# Patient Record
Sex: Female | Born: 1955 | Race: White | Hispanic: No | Marital: Married | Smoking: Former smoker
Health system: Southern US, Community
[De-identification: ages and names within clinical notes are randomized; demographics above are authoritative.]

## PROBLEM LIST (undated history)

## (undated) DIAGNOSIS — E785 Hyperlipidemia, unspecified: Secondary | ICD-10-CM

## (undated) DIAGNOSIS — E669 Obesity, unspecified: Secondary | ICD-10-CM

## (undated) DIAGNOSIS — I10 Essential (primary) hypertension: Secondary | ICD-10-CM

## (undated) DIAGNOSIS — T7840XA Allergy, unspecified, initial encounter: Secondary | ICD-10-CM

## (undated) DIAGNOSIS — J449 Chronic obstructive pulmonary disease, unspecified: Secondary | ICD-10-CM

## (undated) DIAGNOSIS — F32A Depression, unspecified: Secondary | ICD-10-CM

## (undated) DIAGNOSIS — D329 Benign neoplasm of meninges, unspecified: Secondary | ICD-10-CM

## (undated) DIAGNOSIS — M1990 Unspecified osteoarthritis, unspecified site: Secondary | ICD-10-CM

## (undated) DIAGNOSIS — M810 Age-related osteoporosis without current pathological fracture: Secondary | ICD-10-CM

## (undated) DIAGNOSIS — F419 Anxiety disorder, unspecified: Secondary | ICD-10-CM

## (undated) DIAGNOSIS — E8881 Metabolic syndrome: Secondary | ICD-10-CM

## (undated) DIAGNOSIS — Z5189 Encounter for other specified aftercare: Secondary | ICD-10-CM

## (undated) HISTORY — DX: Allergy, unspecified, initial encounter: T78.40XA

## (undated) HISTORY — DX: Chronic obstructive pulmonary disease, unspecified: J44.9

## (undated) HISTORY — PX: CHOLECYSTECTOMY: SHX55

## (undated) HISTORY — DX: Metabolic syndrome: E88.81

## (undated) HISTORY — DX: Depression, unspecified: F32.A

## (undated) HISTORY — DX: Encounter for other specified aftercare: Z51.89

## (undated) HISTORY — DX: Metabolic syndrome: E88.810

## (undated) HISTORY — DX: Hyperlipidemia, unspecified: E78.5

## (undated) HISTORY — PX: DERMOID CYST  EXCISION: SHX1452

## (undated) HISTORY — DX: Benign neoplasm of meninges, unspecified: D32.9

## (undated) HISTORY — DX: Anxiety disorder, unspecified: F41.9

## (undated) HISTORY — PX: APPENDECTOMY: SHX54

## (undated) HISTORY — PX: ECTOPIC PREGNANCY SURGERY: SHX613

## (undated) HISTORY — DX: Essential (primary) hypertension: I10

## (undated) HISTORY — DX: Obesity, unspecified: E66.9

## (undated) HISTORY — DX: Age-related osteoporosis without current pathological fracture: M81.0

## (undated) HISTORY — PX: SPLENECTOMY, TOTAL: SHX788

## (undated) HISTORY — DX: Unspecified osteoarthritis, unspecified site: M19.90

---

## 2006-06-06 ENCOUNTER — Ambulatory Visit: Payer: Self-pay | Admitting: Oncology

## 2011-08-01 ENCOUNTER — Ambulatory Visit (INDEPENDENT_AMBULATORY_CARE_PROVIDER_SITE_OTHER): Payer: Self-pay | Admitting: Surgery

## 2011-08-02 ENCOUNTER — Ambulatory Visit (INDEPENDENT_AMBULATORY_CARE_PROVIDER_SITE_OTHER): Payer: Medicare Other | Admitting: Surgery

## 2011-08-09 ENCOUNTER — Ambulatory Visit (INDEPENDENT_AMBULATORY_CARE_PROVIDER_SITE_OTHER): Payer: Medicare Other | Admitting: Surgery

## 2011-08-09 ENCOUNTER — Encounter (INDEPENDENT_AMBULATORY_CARE_PROVIDER_SITE_OTHER): Payer: Self-pay | Admitting: Surgery

## 2011-08-09 ENCOUNTER — Other Ambulatory Visit (INDEPENDENT_AMBULATORY_CARE_PROVIDER_SITE_OTHER): Payer: Self-pay

## 2011-08-09 DIAGNOSIS — E669 Obesity, unspecified: Secondary | ICD-10-CM

## 2011-08-09 NOTE — Progress Notes (Signed)
Re:   Shannon Hobbs DOB:   12-08-1955 MRN:   [id number]  ASSESSMENT AND PLAN: 1.  Morbid obesity.  Weight 314, BMI - 53.6.  Per the 1991 NIH Consensus Statement, the patient is a candidate for bariatric surgery.  The patient attended our information session and reviewed the different types of bariatric surgery.    The patient is interested in the laparoscopic adjustable gastric band.  I discussed with the patient the indications and risks of lap band surgery.  The potential risks of surgery include, but are not limited to, bleeding, infection, DVT and PE, slippage and erosion of the band, open surgery, and death.  The patient understands the importance of compliance and long term follow-up with our group after surgery.  She was given literature regarding lap band surgery and encouraged to visit their web site, [email or web address], and register.  From here we'll obtain labs, x-rays, nutrition consult, and psych consult.   2.  Hypertension. 3.  Rheumatoid arthritis -  At least 20 years.  Was followed by Dr. Phylliss Bob, now followed by Dr. Dareen Piano. 4.  Chronic steroids - prednisone x 2 years.  I discussed some of the problems with steroids in weight loss surgery - less weight loss, increased risks of erosion, and increased risks of infection. 5.  History of splenectomy/ectopic pregnancy.  No chief complaint on file.  REFERRING PHYSICIAN: Galvin Proffer, MD  HISTORY OF PRESENT ILLNESS: Shannon Hobbs is a 56 y.o. (DOB: 05-Jun-1956)  white female whose primary care physician is HAGUE, Myrene Galas, MD and comes to me today for bariatric surgery.  Ms. Remmers heard Dr. Biagio Quint at the information session.  She has  tried multiple diets including Weight Watchers, TOPS, overeaters anonymous, and low-calorie diets. She has tried prescriptions such as Adipex and a medication that starts with a letter M.  She does not know anyone who has had a lap band.  I encouraged her to go to the support group to meet someone who  has had the procedure.  She had an ectopic pregnancy.  She ended up getting a splenectomy, appendectomy, in addition to managing the ectopic. No history of stomach disease.  No history of liver disease.  Lap chole - 2008 in [place].  No history of pancreas disease.  No history of colon disease.    Past Medical History  Diagnosis Date  . Arthritis   . Hypertension   . Obesity   . Dysmetabolic syndrome       Past Surgical History  Procedure Date  . Dermoid cyst  excision   . Ectopic pregnancy surgery   . Appendectomy   . Cholecystectomy   . Splenectomy, total       Current Outpatient Prescriptions  Medication Sig Dispense Refill  . hydrochlorothiazide (HYDRODIURIL) 25 MG tablet Take 25 mg by mouth daily.      Marland Kitchen ibuprofen (ADVIL,MOTRIN) 200 MG tablet Take 200 mg by mouth every 6 (six) hours as needed.      . Methotrexate Sodium (METHOTREXATE PO) Take 6 mLs by mouth once a week.      . predniSONE (DELTASONE) 10 MG tablet Take 10 mg by mouth daily.          Allergies  Allergen Reactions  . Dilaudid (Hydromorphone Hcl)     REVIEW OF SYSTEMS: Skin:  No history of rash.  No history of abnormal moles. Infection:  No history of hepatitis or HIV.  No history of MRSA. Neurologic:  No history of stroke.  No history of seizure.  No history of headaches. Cardiac:  Hypertension x 2 years.  But she admits that she is not good about taking her meds. Pulmonary:  Does not smoke cigarettes.  No asthma or bronchitis.  No OSA/CPAP.  Endocrine:  No diabetes. No thyroid disease. Gastrointestinal: See HPI. Urologic:  No history of kidney stones.  No history of bladder infections. Musculoskeletal:  History of rheumatoid arthritis.  On methotrexate/prednisone.  Followed by Dr. Azzie Roup.  Hematologic:  No bleeding disorder.  No history of anemia.  Not anticoagulated. Psycho-social:  The patient is oriented.   The patient has no obvious psychologic or social impairment to understanding our  conversation and plan.  SOCIAL and FAMILY HISTORY: Recently married. On disability since 2007 secondary to rheumatoid arthritis.  PHYSICAL EXAM: There were no vitals taken for this visit.  General: WN morbidly obese WF who is alert and generally healthy appearing.  HEENT: Normal. Pupils equal. Good dentition. Neck: Supple. No mass.  No thyroid mass.  Carotid pulse okay with no bruit. Lymph Nodes:  No supraclavicular or cervical nodes. Lungs: Clear to auscultation and symmetric breath sounds. Heart:  RRR. No murmur or rub.  Abdomen: Soft. No mass. No tenderness. No hernia. Normal bowel sounds.  Long midline (or to left of midline) incision.  She said they had no trouble with her lap chole. Rectal: No mass.  Guaiac neg. Extremities:  Good strength and ROM  in upper and lower extremities. Neurologic:  Grossly intact to motor and sensory function. Psychiatric: Has normal mood and affect. Behavior is normal.   DATA REVIEWED: Didn't have much  Ovidio Kin, MD,  [facility], [place] [address] [place]  [address]   [place], [place]    [address] Phone:  [phone] FAX:  [phone]

## 2011-08-26 ENCOUNTER — Encounter: Payer: Self-pay | Admitting: *Deleted

## 2011-08-29 ENCOUNTER — Encounter (HOSPITAL_COMMUNITY)
Admission: RE | Disposition: A | Discharge status: Home / Self Care | Payer: Self-pay | Source: Ambulatory Visit | Attending: Surgery

## 2011-08-29 ENCOUNTER — Ambulatory Visit (HOSPITAL_COMMUNITY)
Admission: RE | Admit: 2011-08-29 | Discharge: 2011-08-29 | Disposition: A | Discharge status: Home / Self Care | Payer: Medicare Other | Source: Ambulatory Visit | Attending: Surgery | Admitting: Surgery

## 2011-08-29 DIAGNOSIS — Z01818 Encounter for other preprocedural examination: Secondary | ICD-10-CM | POA: Insufficient documentation

## 2011-08-29 HISTORY — PX: BREATH TEK H PYLORI: SHX5422

## 2011-08-29 SURGERY — BREATH TEST, FOR HELICOBACTER PYLORI

## 2011-08-30 ENCOUNTER — Encounter (HOSPITAL_COMMUNITY): Payer: Self-pay

## 2011-08-30 ENCOUNTER — Encounter (HOSPITAL_COMMUNITY): Payer: Self-pay | Admitting: Surgery

## 2011-09-10 ENCOUNTER — Other Ambulatory Visit (INDEPENDENT_AMBULATORY_CARE_PROVIDER_SITE_OTHER): Payer: Self-pay | Admitting: Surgery

## 2011-09-10 ENCOUNTER — Encounter: Payer: Medicare Other | Attending: Surgery | Admitting: *Deleted

## 2011-09-10 ENCOUNTER — Encounter: Payer: Self-pay | Admitting: *Deleted

## 2011-09-10 DIAGNOSIS — Z01818 Encounter for other preprocedural examination: Secondary | ICD-10-CM | POA: Insufficient documentation

## 2011-09-10 DIAGNOSIS — Z713 Dietary counseling and surveillance: Secondary | ICD-10-CM | POA: Insufficient documentation

## 2011-09-10 NOTE — Patient Instructions (Signed)
   Follow Pre-Op Nutrition Goals to prepare for LAGB Surgery.   Call the [facility] at [phone] once you have been given your surgery date to enrolled in the Pre-Op Nutrition Class. You will need to attend this nutrition class 3-4 weeks prior to your surgery. 

## 2011-09-10 NOTE — Progress Notes (Signed)
  Pre-Op Assessment Visit: Pre-Operative LAGB Surgery  Medical Nutrition Therapy:  Appt start time: 1145 end time:  1245.  Patient was seen on 09/10/2011 for Pre-Operative LAGB Nutrition Assessment. Assessment and letter of approval faxed to [facility] Bariatric Surgery Program coordinator on 09/10/2011.  Approval letter sent to [facility] and will be available in the chart under the media tab.  Handouts given during visit include:  Pre-Op Goals Handout  Patient to call for Pre-Op and Post-Op Nutrition Education at the [facility] when surgery is scheduled.

## 2011-09-11 LAB — CBC WITH DIFFERENTIAL/PLATELET
Basophils Absolute: 0 10*3/uL (ref 0.0–0.1)
Basophils Relative: 0 % (ref 0–1)
Eosinophils Absolute: 0.4 10*3/uL (ref 0.0–0.7)
MCH: 30.1 pg (ref 26.0–34.0)
MCHC: 32 g/dL (ref 30.0–36.0)
Monocytes Relative: 8 % (ref 3–12)
Neutrophils Relative %: 71 % (ref 43–77)
Platelets: 511 10*3/uL — ABNORMAL HIGH (ref 150–400)
RDW: 15.6 % — ABNORMAL HIGH (ref 11.5–15.5)

## 2011-09-11 LAB — COMPREHENSIVE METABOLIC PANEL
Alkaline Phosphatase: 81 U/L (ref 39–117)
Glucose, Bld: 86 mg/dL (ref 70–99)
Sodium: 140 mEq/L (ref 135–145)
Total Bilirubin: 0.4 mg/dL (ref 0.3–1.2)
Total Protein: 7 g/dL (ref 6.0–8.3)

## 2011-09-20 ENCOUNTER — Other Ambulatory Visit (INDEPENDENT_AMBULATORY_CARE_PROVIDER_SITE_OTHER): Payer: Self-pay | Admitting: Surgery

## 2011-09-20 ENCOUNTER — Other Ambulatory Visit (INDEPENDENT_AMBULATORY_CARE_PROVIDER_SITE_OTHER): Payer: Self-pay

## 2011-09-20 DIAGNOSIS — E669 Obesity, unspecified: Secondary | ICD-10-CM

## 2011-09-23 ENCOUNTER — Ambulatory Visit (HOSPITAL_COMMUNITY)
Admission: RE | Admit: 2011-09-23 | Discharge: 2011-09-23 | Disposition: A | Discharge status: Home / Self Care | Payer: Medicare Other | Source: Ambulatory Visit | Attending: Surgery | Admitting: Surgery

## 2011-09-23 ENCOUNTER — Other Ambulatory Visit: Payer: Self-pay

## 2011-09-23 DIAGNOSIS — F17200 Nicotine dependence, unspecified, uncomplicated: Secondary | ICD-10-CM | POA: Insufficient documentation

## 2011-09-23 DIAGNOSIS — I10 Essential (primary) hypertension: Secondary | ICD-10-CM | POA: Insufficient documentation

## 2011-09-23 DIAGNOSIS — E8881 Metabolic syndrome: Secondary | ICD-10-CM | POA: Insufficient documentation

## 2011-09-23 DIAGNOSIS — Z6841 Body Mass Index (BMI) 40.0 and over, adult: Secondary | ICD-10-CM | POA: Insufficient documentation

## 2012-03-09 ENCOUNTER — Other Ambulatory Visit (INDEPENDENT_AMBULATORY_CARE_PROVIDER_SITE_OTHER): Payer: Self-pay | Admitting: Surgery

## 2012-03-16 ENCOUNTER — Encounter: Payer: Medicare Other | Attending: Surgery | Admitting: *Deleted

## 2012-03-16 DIAGNOSIS — Z713 Dietary counseling and surveillance: Secondary | ICD-10-CM | POA: Insufficient documentation

## 2012-03-16 DIAGNOSIS — Z01818 Encounter for other preprocedural examination: Secondary | ICD-10-CM | POA: Insufficient documentation

## 2012-03-16 NOTE — Progress Notes (Addendum)
Pre-Operative Nutrition Visit:  Appt start time: 1245 end time:  1345.  Patient was seen on 03/16/12 for Pre-Operative Bariatric Surgery Education at the [facility].   Surgery date: 03/31/12 Surgery type: LAGB Start weight at [facility]: 315.4 lbs  Weight today: 332.5 lbs Weight change: n/a Total weight lost: n/a BMI: 57.1 kg/m^2  Samples given per MNT protocol: Bariatric Advantage Multivitamin Lot # [id number] Exp:12/13  Bariatric Advantage Calcium Citrate Lot # [id number] Exp:12/13  Celebrate Vitamins Multivitamin Lot # [id number] Exp: 01/15  Celebrate Vitamins Multivitamin Lot # [id number] Exp: 09/14  Celebrate Vitamins Calcium Citrate Lot # [id number] Exp:10/14  Celebrate Vitamins Sublingual B-12 Lot # [id number] Exp: 05/15  Opurity Bypass & Sleeve Chewable Multivitamin Lot # [id number] Exp: 11/14  Unjury Protein Powder  Chicken Soup: Lot # [id number]; Exp: 10/14  Unflavored: Lot # [id number]; Exp: 09/14  The following the learning objective met by the patient during this course:   Identifies Pre-Op Dietary Goals and will begin 2 weeks pre-operatively  Identifies appropriate sources of fluids and proteins   States protein recommendations and appropriate sources pre and post-operatively  Identifies Post-Operative Dietary Goals and will follow for 2 weeks post-operatively  Identifies appropriate multivitamin and calcium sources  Describes the need for physical activity post-operatively and will follow MD recommendations  States when to call healthcare provider regarding medication questions or post-operative complications  Handouts given during class include:  Pre-Op Bariatric Surgery Diet Handout  Protein Shake Handout  Post-Op Bariatric Surgery Nutrition Handout  BELT Program Information Flyer  Support Group Information Flyer  Follow-Up Plan: Patient will follow-up at [facility] 2 weeks post operatively for diet advancement per MD.

## 2012-03-17 ENCOUNTER — Encounter: Payer: Self-pay | Admitting: *Deleted

## 2012-03-17 NOTE — Patient Instructions (Signed)
Follow:   Pre-Op Diet per MD 2 weeks prior to surgery  Phase 2- Liquids (clear/full) 2 weeks after surgery  Vitamin/Mineral/Calcium guidelines for purchasing bariatric supplements  Exercise guidelines pre and post-op per MD  Follow-up at [facility] in 2 weeks post-op for diet advancement. Contact [facility] as needed with questions/concerns. 

## 2012-03-20 ENCOUNTER — Encounter (HOSPITAL_COMMUNITY): Payer: Self-pay | Admitting: Pharmacy Technician

## 2012-03-27 ENCOUNTER — Ambulatory Visit (HOSPITAL_COMMUNITY)
Admission: RE | Admit: 2012-03-27 | Discharge: 2012-03-27 | Disposition: A | Discharge status: Home / Self Care | Payer: Medicare Other | Source: Ambulatory Visit | Attending: Surgery | Admitting: Surgery

## 2012-03-27 ENCOUNTER — Encounter (HOSPITAL_COMMUNITY)
Admission: RE | Admit: 2012-03-27 | Discharge: 2012-03-27 | Disposition: A | Discharge status: Home / Self Care | Payer: Medicare Other | Source: Ambulatory Visit | Attending: Surgery | Admitting: Surgery

## 2012-03-27 ENCOUNTER — Encounter (HOSPITAL_COMMUNITY): Payer: Self-pay

## 2012-03-27 ENCOUNTER — Encounter (INDEPENDENT_AMBULATORY_CARE_PROVIDER_SITE_OTHER): Payer: Self-pay | Admitting: Surgery

## 2012-03-27 ENCOUNTER — Ambulatory Visit (INDEPENDENT_AMBULATORY_CARE_PROVIDER_SITE_OTHER): Payer: Medicare Other | Admitting: Surgery

## 2012-03-27 DIAGNOSIS — Z01812 Encounter for preprocedural laboratory examination: Secondary | ICD-10-CM | POA: Insufficient documentation

## 2012-03-27 DIAGNOSIS — I10 Essential (primary) hypertension: Secondary | ICD-10-CM | POA: Insufficient documentation

## 2012-03-27 LAB — BASIC METABOLIC PANEL
BUN: 20 mg/dL (ref 6–23)
Chloride: 94 mEq/L — ABNORMAL LOW (ref 96–112)
Glucose, Bld: 86 mg/dL (ref 70–99)
Potassium: 3.3 mEq/L — ABNORMAL LOW (ref 3.5–5.1)

## 2012-03-27 LAB — CBC
Hemoglobin: 15.3 g/dL — ABNORMAL HIGH (ref 12.0–15.0)
MCH: 30.6 pg (ref 26.0–34.0)
MCV: 90.8 fL (ref 78.0–100.0)
Platelets: 453 10*3/uL — ABNORMAL HIGH (ref 150–400)
RBC: 5 MIL/uL (ref 3.87–5.11)

## 2012-03-27 NOTE — Progress Notes (Signed)
03/27/12 1023  OBSTRUCTIVE SLEEP APNEA  Have you ever been diagnosed with sleep apnea through a sleep study? No  Do you snore loudly (loud enough to be heard through closed doors)?  1  Do you often feel tired, fatigued, or sleepy during the daytime? 0  Has anyone observed you stop breathing during your sleep? 0  Do you have, or are you being treated for high blood pressure? 1  BMI more than 35 kg/m2? 1  Age over 54 years old? 1  Neck circumference greater than 40 cm/18 inches? 0  Gender: 0  Obstructive Sleep Apnea Score 4   Score 4 or greater  Updated health history

## 2012-03-27 NOTE — Patient Instructions (Signed)
20 LAKENZIE MCCLAFFERTY  03/27/2012   Your procedure is scheduled on:  Tuesday 03/31/2012 at 105 pm  Report to [facility] at 1030 AM.  Call this number if you have problems the morning of surgery: [phone]   Remember:   Do not eat food:After Midnight.  May have clear liquids: up to 4 Hours before arrival- MAY HAVE CLEAR LIQUIDS FROM MIDNIGHT UP UNTIL 0700 AM MORNING OF SURGERY THEN NOTHING UNTIL AFTER SURGERY.    Take these medicines the morning of surgery with A SIP OF WATER: PREDNISONE   Do not wear jewelry  Do not wear lotions, powders, or perfumes.   Do not shave 48 hours prior to surgery. Men may shave face and neck.  Do not bring valuables to the hospital.  Contacts, dentures or bridgework may not be worn into surgery.  Leave suitcase in the car. After surgery it may be brought to your room.  For patients admitted to the hospital, checkout time is 11:00 AM the day of discharge.       Special Instructions: CHG Shower Use Special Wash: 1/2 bottle night before surgery and 1/2 bottle morning of surgery.   Please read over the following fact sheets that you were given: MRSA Information, Sleep Apnea sheet, Incentive Spirometry sheet               Any questions , please call me at [phone] [facility].Georgeanna Lea, RN,BSN

## 2012-03-27 NOTE — Progress Notes (Addendum)
Re:   Shannon Hobbs DOB:   01-30-1956 MRN:   [id number]  ASSESSMENT AND PLAN: 1.  Morbid obesity.  Weight 319, BMI - 54.9.  Per the 1991 NIH Consensus Statement, the patient is a candidate for bariatric surgery.    The patient is interested in the laparoscopic adjustable gastric band.  I discussed with the patient the indications and risks of lap band surgery.  The potential risks of surgery include, but are not limited to, bleeding, infection, DVT and PE, slippage and erosion of the band, open surgery, and death.  The patient understands the importance of compliance and long term follow-up with our group after surgery.  We talked about that she had gained about 5 pounds - but she said that she quit smoking.  She was supposed to have brought her husband, but didn't.  She is for surgery 03/31/2012.  She is ready for the surgery and I answered her questions.   2.  Hypertension. 3.  Rheumatoid arthritis -  At least 20 years.    Was followed by Dr. Phylliss Hobbs, now followed by Dr. Dareen Hobbs.  She has an appt with Dr. Dareen Hobbs about one month post op to maybe wean the prednisone 4.  Chronic steroids - prednisone 10 mg qd x 2 years.  I discussed some of the problems with steroids in weight loss surgery - less weight loss, increased risks of erosion, and increased risks of infection. 5.  History of splenectomy/ectopic pregnancy. 6.  Quit smoking - Feb 2013. 7.  Chronic ibuprofen use.  Discussed the pros and cons with a lap band. 8.  Chronic leukocytosis - no signs of infection.  On chronic steroids and has had a splenectomy. DN 03/30/2012]  Chief Complaint  Patient presents with  . Pre-op Exam    lap band   REFERRING PHYSICIAN: HAGUE, Shannon Galas, MD  HISTORY OF PRESENT ILLNESS: [name] is a 56 y.o. (DOB: 1955/11/22)  white female whose primary care physician is Hobbs, Shannon Galas, MD and comes to me today for pre op visit for lap band.  I again went over the lap band with her.  The biggest question  is how much the prior splenectomy will get in the way.  I told her that there is a small chance that I may not be able to place the lap band.  We also talked about the risks of NSAIDs and steroids and the lap band.  UGI - 09/23/2011 - negative. Psych eval - Dr. Tiajuana Hobbs - 01/09/2012  She had an ectopic pregnancy.  She ended up getting a splenectomy, appendectomy, in addition to managing the ectopic. No history of stomach disease.  No history of liver disease.  Lap chole - 2008 in [place].  No history of pancreas disease.  No history of colon disease.    Past Medical History  Diagnosis Date  . Arthritis   . Hypertension   . Obesity   . Dysmetabolic syndrome       Past Surgical History  Procedure Date  . Dermoid cyst  excision   . Ectopic pregnancy surgery   . Appendectomy   . Cholecystectomy   . Splenectomy, total   . Breath tek h pylori 08/29/2011    Procedure: BREATH TEK H PYLORI;  Surgeon: Shannon Cocking, MD;  Location: [facility];  Service: General;  Laterality: N/A;      Current Outpatient Prescriptions  Medication Sig Dispense Refill  . hydrochlorothiazide (HYDRODIURIL) 25 MG tablet Take 25 mg  by mouth daily after breakfast.       . ibuprofen (ADVIL,MOTRIN) 200 MG tablet Take 400 mg by mouth 2 (two) times daily.       . methotrexate (RHEUMATREX) 2.5 MG tablet Take 20 mg by mouth once a week. Caution:Chemotherapy. Protect from light.      . predniSONE (DELTASONE) 10 MG tablet Take 5-10 mg by mouth daily. Takes 5 mg one day and 10 mg the next          Allergies  Allergen Reactions  . Dilaudid (Hydromorphone Hcl) Nausea And Vomiting  . Percocet (Oxycodone-Acetaminophen) Nausea And Vomiting    REVIEW OF SYSTEMS: Cardiac:  Hypertension x 2 years.  But she admits that she is not good about taking her meds.  Gastrointestinal: See HPI. Urologic:  No history of kidney stones.  No history of bladder infections. Musculoskeletal:  History of rheumatoid arthritis.  On  methotrexate/prednisone.  Followed by Dr. Azzie Hobbs.   SOCIAL and FAMILY HISTORY: Recently married. On disability since 2007 secondary to rheumatoid arthritis.  PHYSICAL EXAM: BP 138/64  Pulse 60  Temp 96.8 F (36 C) (Oral)  Resp 20  Ht 5\' 4"  (1.626 m)  Wt 319 lb 12.8 oz (145.06 kg)  BMI 54.89 kg/m2  General: WN morbidly obese WF who is alert and generally healthy appearing.  HEENT: Normal. Pupils equal. Good dentition. Neck: Supple. No mass.  No thyroid mass.  Carotid pulse okay with no bruit. Lymph Nodes:  No supraclavicular or cervical nodes. Lungs: Clear to auscultation and symmetric breath sounds. Heart:  RRR. No murmur or rub.  Abdomen: Soft. No mass. No tenderness. No hernia. Normal bowel sounds.  Long midline (or to left of midline) incision.  She said they had no trouble with her lap chole.  She is more apple than pear.  She has a moderate panus with a superficial dermatitis below the panus. Rectal: No mass. Extremities:  Good strength and ROM  in upper and lower extremities. Neurologic:  Grossly intact to motor and sensory function. Psychiatric: Has normal mood and affect. Behavior is normal.   DATA REVIEWED: See HPI  Shannon Kin, MD,  [facility] [place] [address] [place]  [address]   [place], [place]    [address] Phone:  [phone] FAX:  [phone]

## 2012-03-31 ENCOUNTER — Encounter (HOSPITAL_COMMUNITY)
Admission: RE | Disposition: A | Discharge status: Home / Self Care | Payer: Self-pay | Source: Ambulatory Visit | Attending: Surgery

## 2012-03-31 ENCOUNTER — Encounter (HOSPITAL_COMMUNITY): Payer: Self-pay | Admitting: Anesthesiology

## 2012-03-31 ENCOUNTER — Inpatient Hospital Stay (HOSPITAL_COMMUNITY): Payer: Medicare Other | Admitting: Anesthesiology

## 2012-03-31 ENCOUNTER — Ambulatory Visit (HOSPITAL_COMMUNITY)
Admission: RE | Admit: 2012-03-31 | Discharge: 2012-04-02 | Disposition: A | Discharge status: Home / Self Care | Payer: Medicare Other | Source: Ambulatory Visit | Attending: Surgery | Admitting: Surgery

## 2012-03-31 ENCOUNTER — Encounter (HOSPITAL_COMMUNITY): Payer: Self-pay | Admitting: *Deleted

## 2012-03-31 DIAGNOSIS — Z79899 Other long term (current) drug therapy: Secondary | ICD-10-CM | POA: Insufficient documentation

## 2012-03-31 DIAGNOSIS — L909 Atrophic disorder of skin, unspecified: Secondary | ICD-10-CM | POA: Insufficient documentation

## 2012-03-31 DIAGNOSIS — I10 Essential (primary) hypertension: Secondary | ICD-10-CM | POA: Insufficient documentation

## 2012-03-31 DIAGNOSIS — Z6841 Body Mass Index (BMI) 40.0 and over, adult: Secondary | ICD-10-CM

## 2012-03-31 DIAGNOSIS — IMO0002 Reserved for concepts with insufficient information to code with codable children: Secondary | ICD-10-CM | POA: Insufficient documentation

## 2012-03-31 DIAGNOSIS — M069 Rheumatoid arthritis, unspecified: Secondary | ICD-10-CM

## 2012-03-31 DIAGNOSIS — Z9089 Acquired absence of other organs: Secondary | ICD-10-CM | POA: Insufficient documentation

## 2012-03-31 DIAGNOSIS — Z87891 Personal history of nicotine dependence: Secondary | ICD-10-CM | POA: Insufficient documentation

## 2012-03-31 DIAGNOSIS — K660 Peritoneal adhesions (postprocedural) (postinfection): Secondary | ICD-10-CM | POA: Insufficient documentation

## 2012-03-31 DIAGNOSIS — D72829 Elevated white blood cell count, unspecified: Secondary | ICD-10-CM | POA: Insufficient documentation

## 2012-03-31 DIAGNOSIS — L919 Hypertrophic disorder of the skin, unspecified: Secondary | ICD-10-CM | POA: Insufficient documentation

## 2012-03-31 HISTORY — PX: LAPAROSCOPIC GASTRIC BANDING: SHX1100

## 2012-03-31 SURGERY — GASTRIC BANDING, LAPAROSCOPIC
Anesthesia: General | Site: Abdomen | Wound class: Clean Contaminated

## 2012-03-31 MED ORDER — FENTANYL CITRATE 0.05 MG/ML IJ SOLN
25.0000 ug | INTRAMUSCULAR | Status: DC | PRN
  Administered 2012-03-31 (×2): 25 ug via INTRAVENOUS

## 2012-03-31 MED ORDER — DIPHENHYDRAMINE HCL 50 MG/ML IJ SOLN
INTRAMUSCULAR | Status: DC | PRN
  Administered 2012-03-31: 12.5 mg via INTRAVENOUS

## 2012-03-31 MED ORDER — SUCCINYLCHOLINE CHLORIDE 20 MG/ML IJ SOLN
INTRAMUSCULAR | Status: DC | PRN
  Administered 2012-03-31: 100 mg via INTRAVENOUS

## 2012-03-31 MED ORDER — DEXTROSE 5 % IV SOLN
3.0000 g | INTRAVENOUS | Status: AC
  Administered 2012-03-31: 3 g via INTRAVENOUS
  Filled 2012-03-31: qty 3000

## 2012-03-31 MED ORDER — UNJURY CHOCOLATE CLASSIC POWDER
2.0000 [oz_av] | Freq: Four times a day (QID) | ORAL | Status: DC
  Administered 2012-04-01 (×2): 2 [oz_av] via ORAL

## 2012-03-31 MED ORDER — MORPHINE SULFATE 2 MG/ML IJ SOLN
2.0000 mg | INTRAMUSCULAR | Status: DC | PRN
  Administered 2012-03-31 – 2012-04-01 (×4): 4 mg via INTRAVENOUS
  Filled 2012-03-31 (×4): qty 2

## 2012-03-31 MED ORDER — UNJURY VANILLA POWDER
2.0000 [oz_av] | Freq: Four times a day (QID) | ORAL | Status: DC
  Administered 2012-04-01: 2 [oz_av] via ORAL

## 2012-03-31 MED ORDER — ONDANSETRON HCL 4 MG/2ML IJ SOLN
INTRAMUSCULAR | Status: DC | PRN
  Administered 2012-03-31: 4 mg via INTRAVENOUS

## 2012-03-31 MED ORDER — UNJURY CHICKEN SOUP POWDER
2.0000 [oz_av] | Freq: Four times a day (QID) | ORAL | Status: DC
  Administered 2012-04-02: 2 [oz_av] via ORAL

## 2012-03-31 MED ORDER — PROMETHAZINE HCL 25 MG/ML IJ SOLN: 6.2500 mg | INTRAMUSCULAR | Status: DC | PRN

## 2012-03-31 MED ORDER — PREDNISONE 10 MG PO TABS
10.0000 mg | ORAL_TABLET | ORAL | Status: DC
  Administered 2012-04-01: 10 mg via ORAL
  Filled 2012-03-31: qty 1

## 2012-03-31 MED ORDER — ROCURONIUM BROMIDE 100 MG/10ML IV SOLN
INTRAVENOUS | Status: DC | PRN
  Administered 2012-03-31: 5 mg via INTRAVENOUS
  Administered 2012-03-31: 45 mg via INTRAVENOUS
  Administered 2012-03-31: 10 mg via INTRAVENOUS

## 2012-03-31 MED ORDER — BUPIVACAINE HCL (PF) 0.25 % IJ SOLN
INTRAMUSCULAR | Status: AC
  Filled 2012-03-31: qty 60

## 2012-03-31 MED ORDER — LACTATED RINGERS IV SOLN
INTRAVENOUS | Status: DC
  Administered 2012-03-31: 15:00:00 via INTRAVENOUS

## 2012-03-31 MED ORDER — SODIUM CHLORIDE 0.9 % IJ SOLN
INTRAMUSCULAR | Status: DC | PRN
  Administered 2012-03-31: 10 mL

## 2012-03-31 MED ORDER — ACETAMINOPHEN 10 MG/ML IV SOLN
1000.0000 mg | Freq: Four times a day (QID) | INTRAVENOUS | Status: AC
  Administered 2012-03-31 – 2012-04-01 (×4): 1000 mg via INTRAVENOUS
  Filled 2012-03-31 (×5): qty 100

## 2012-03-31 MED ORDER — FENTANYL CITRATE 0.05 MG/ML IJ SOLN
INTRAMUSCULAR | Status: AC
  Filled 2012-03-31: qty 2

## 2012-03-31 MED ORDER — FENTANYL CITRATE 0.05 MG/ML IJ SOLN
INTRAMUSCULAR | Status: DC | PRN
  Administered 2012-03-31: 25 ug via INTRAVENOUS
  Administered 2012-03-31: 50 ug via INTRAVENOUS
  Administered 2012-03-31: 100 ug via INTRAVENOUS
  Administered 2012-03-31: 50 ug via INTRAVENOUS
  Administered 2012-03-31: 25 ug via INTRAVENOUS

## 2012-03-31 MED ORDER — MIDAZOLAM HCL 5 MG/5ML IJ SOLN
INTRAMUSCULAR | Status: DC | PRN
  Administered 2012-03-31: 2 mg via INTRAVENOUS

## 2012-03-31 MED ORDER — PROPOFOL 10 MG/ML IV BOLUS
INTRAVENOUS | Status: DC | PRN
  Administered 2012-03-31: 200 mg via INTRAVENOUS

## 2012-03-31 MED ORDER — METOCLOPRAMIDE HCL 5 MG/ML IJ SOLN
INTRAMUSCULAR | Status: DC | PRN
  Administered 2012-03-31: 10 mg via INTRAVENOUS

## 2012-03-31 MED ORDER — HEPARIN SODIUM (PORCINE) 5000 UNIT/ML IJ SOLN
5000.0000 [IU] | Freq: Three times a day (TID) | INTRAMUSCULAR | Status: DC
  Administered 2012-03-31 – 2012-04-02 (×5): 5000 [IU] via SUBCUTANEOUS
  Filled 2012-03-31 (×8): qty 1

## 2012-03-31 MED ORDER — HYDROCHLOROTHIAZIDE 25 MG PO TABS
25.0000 mg | ORAL_TABLET | Freq: Every day | ORAL | Status: DC
  Administered 2012-04-01: 25 mg via ORAL
  Filled 2012-03-31 (×3): qty 1

## 2012-03-31 MED ORDER — PREDNISONE 5 MG PO TABS
5.0000 mg | ORAL_TABLET | ORAL | Status: DC
  Filled 2012-03-31 (×2): qty 1

## 2012-03-31 MED ORDER — DEXAMETHASONE SODIUM PHOSPHATE 4 MG/ML IJ SOLN
INTRAMUSCULAR | Status: DC | PRN
  Administered 2012-03-31: 10 mg via INTRAVENOUS

## 2012-03-31 MED ORDER — OXYCODONE-ACETAMINOPHEN 5-325 MG/5ML PO SOLN: 5.0000 mL | ORAL | Status: DC | PRN

## 2012-03-31 MED ORDER — HEPARIN SODIUM (PORCINE) 5000 UNIT/ML IJ SOLN
5000.0000 [IU] | Freq: Once | INTRAMUSCULAR | Status: AC
  Administered 2012-03-31: 5000 [IU] via SUBCUTANEOUS

## 2012-03-31 MED ORDER — ACETAMINOPHEN 10 MG/ML IV SOLN
INTRAVENOUS | Status: DC | PRN
  Administered 2012-03-31: 1000 mg via INTRAVENOUS

## 2012-03-31 MED ORDER — BUPIVACAINE HCL (PF) 0.25 % IJ SOLN
INTRAMUSCULAR | Status: DC | PRN
  Administered 2012-03-31: 35 mL

## 2012-03-31 MED ORDER — KETOROLAC TROMETHAMINE 30 MG/ML IJ SOLN: 15.0000 mg | Freq: Once | INTRAMUSCULAR | Status: DC | PRN

## 2012-03-31 MED ORDER — ONDANSETRON HCL 4 MG/2ML IJ SOLN
4.0000 mg | INTRAMUSCULAR | Status: DC | PRN
  Administered 2012-04-01 (×2): 4 mg via INTRAVENOUS
  Filled 2012-03-31 (×2): qty 2

## 2012-03-31 MED ORDER — HYDRALAZINE HCL 20 MG/ML IJ SOLN
INTRAMUSCULAR | Status: DC | PRN
  Administered 2012-03-31: 2 mg via INTRAVENOUS

## 2012-03-31 MED ORDER — 0.9 % SODIUM CHLORIDE (POUR BTL) OPTIME
TOPICAL | Status: DC | PRN
  Administered 2012-03-31: 1000 mL

## 2012-03-31 MED ORDER — ACETAMINOPHEN 160 MG/5ML PO SOLN: 650.0000 mg | ORAL | Status: DC | PRN

## 2012-03-31 MED ORDER — KETAMINE HCL 10 MG/ML IJ SOLN
INTRAMUSCULAR | Status: DC | PRN
  Administered 2012-03-31 (×2): 1 mg via INTRAVENOUS
  Administered 2012-03-31: 2 mg via INTRAVENOUS
  Administered 2012-03-31: 1 mg via INTRAVENOUS
  Administered 2012-03-31: 2 mg via INTRAVENOUS
  Administered 2012-03-31: 1 mg via INTRAVENOUS
  Administered 2012-03-31: 2 mg via INTRAVENOUS
  Administered 2012-03-31: 25 mg via INTRAVENOUS

## 2012-03-31 SURGICAL SUPPLY — 66 items
BAND LAP VG SYSTEM W/TUBES (Band) ×1 IMPLANT
BENZOIN TINCTURE PRP APPL 2/3 (GAUZE/BANDAGES/DRESSINGS) IMPLANT
BLADE HEX COATED 2.75 (ELECTRODE) ×2 IMPLANT
BLADE SURG 15 STRL LF DISP TIS (BLADE) ×1 IMPLANT
BLADE SURG 15 STRL SS (BLADE) ×1
CABLE HIGH FREQUENCY MONO STRZ (ELECTRODE) ×2 IMPLANT
CANISTER SUCTION 2500CC (MISCELLANEOUS) IMPLANT
CHLORAPREP W/TINT 26ML (MISCELLANEOUS) ×2 IMPLANT
CLOTH BEACON ORANGE TIMEOUT ST (SAFETY) ×2 IMPLANT
DECANTER SPIKE VIAL GLASS SM (MISCELLANEOUS) ×2 IMPLANT
DERMABOND ADVANCED (GAUZE/BANDAGES/DRESSINGS) ×1
DERMABOND ADVANCED .7 DNX12 (GAUZE/BANDAGES/DRESSINGS) ×1 IMPLANT
DEVICE SUT QUICK LOAD TK 5 (STAPLE) ×6 IMPLANT
DEVICE SUT TI-KNOT TK 5X26 (MISCELLANEOUS) ×2 IMPLANT
DEVICE SUTURE ENDOST 10MM (ENDOMECHANICALS) IMPLANT
DISSECTOR BLUNT TIP ENDO 5MM (MISCELLANEOUS) IMPLANT
DRAPE CAMERA CLOSED 9X96 (DRAPES) ×2 IMPLANT
ELECT REM PT RETURN 9FT ADLT (ELECTROSURGICAL) ×2
ELECTRODE REM PT RTRN 9FT ADLT (ELECTROSURGICAL) ×1 IMPLANT
GLOVE BIOGEL M 8.0 STRL (GLOVE) ×2 IMPLANT
GLOVE BIOGEL PI IND STRL 7.0 (GLOVE) ×1 IMPLANT
GLOVE BIOGEL PI INDICATOR 7.0 (GLOVE) ×1
GLOVE SS BIOGEL STRL SZ 7 (GLOVE) ×1 IMPLANT
GLOVE SUPERSENSE BIOGEL SZ 7 (GLOVE) ×1
GLOVE SURG SIGNA 7.5 PF LTX (GLOVE) ×2 IMPLANT
GLOVE SURG SS PI 6.5 STRL IVOR (GLOVE) ×2 IMPLANT
GOWN STRL NON-REIN LRG LVL3 (GOWN DISPOSABLE) ×4 IMPLANT
GOWN STRL REIN XL XLG (GOWN DISPOSABLE) ×4 IMPLANT
HOVERMATT SINGLE USE (MISCELLANEOUS) ×2 IMPLANT
KIT BASIN OR (CUSTOM PROCEDURE TRAY) ×2 IMPLANT
LAP BAND VG SYSTEM W/TUBES (Band) ×2 IMPLANT
MESH HERNIA 1X4 RECT BARD (Mesh General) ×1 IMPLANT
MESH HERNIA BARD 1X4 (Mesh General) ×1 IMPLANT
NEEDLE SPNL 22GX3.5 QUINCKE BK (NEEDLE) ×2 IMPLANT
NS IRRIG 1000ML POUR BTL (IV SOLUTION) ×2 IMPLANT
PACK UNIVERSAL I (CUSTOM PROCEDURE TRAY) ×2 IMPLANT
PENCIL BUTTON HOLSTER BLD 10FT (ELECTRODE) ×2 IMPLANT
SCALPEL HARMONIC ACE (MISCELLANEOUS) ×2 IMPLANT
SET IRRIG TUBING LAPAROSCOPIC (IRRIGATION / IRRIGATOR) IMPLANT
SLEEVE XCEL OPT CAN 5 100 (ENDOMECHANICALS) ×4 IMPLANT
SLEEVE Z-THREAD 5X100MM (TROCAR) IMPLANT
SOLUTION ANTI FOG 6CC (MISCELLANEOUS) ×2 IMPLANT
SPONGE LAP 18X18 X RAY DECT (DISPOSABLE) ×2 IMPLANT
STAPLER VISISTAT 35W (STAPLE) ×2 IMPLANT
STRIP CLOSURE SKIN 1/2X4 (GAUZE/BANDAGES/DRESSINGS) IMPLANT
SUT ETHIBOND 2 0 SH (SUTURE) ×3
SUT ETHIBOND 2 0 SH 36X2 (SUTURE) ×3 IMPLANT
SUT PROLENE 2 0 CT2 30 (SUTURE) ×2 IMPLANT
SUT SILK 0 (SUTURE) ×1
SUT SILK 0 30XBRD TIE 6 (SUTURE) ×1 IMPLANT
SUT SURGIDAC NAB ES-9 0 48 120 (SUTURE) IMPLANT
SUT VIC AB 2-0 SH 27 (SUTURE) ×1
SUT VIC AB 2-0 SH 27X BRD (SUTURE) ×1 IMPLANT
SUT VIC AB 5-0 PS2 18 (SUTURE) ×4 IMPLANT
SYR 20CC LL (SYRINGE) ×2 IMPLANT
SYR CONTROL 10ML LL (SYRINGE) ×2 IMPLANT
SYS KII OPTICAL ACCESS 15MM (TROCAR) ×2
SYSTEM KII OPTICAL ACCESS 15MM (TROCAR) ×1 IMPLANT
TOWEL OR 17X26 10 PK STRL BLUE (TOWEL DISPOSABLE) ×2 IMPLANT
TROCAR BLADELESS OPT 5 100 (ENDOMECHANICALS) ×2 IMPLANT
TROCAR XCEL NON-BLD 11X100MML (ENDOMECHANICALS) ×2 IMPLANT
TROCAR Z-THREAD FIOS 11X100 BL (TROCAR) ×2 IMPLANT
TROCAR Z-THREAD FIOS 5X100MM (TROCAR) ×2 IMPLANT
TROCAR Z-THREAD SLEEVE 11X100 (TROCAR) ×2 IMPLANT
TUBE CALIBRATION LAPBAND (TUBING) ×2 IMPLANT
TUBING INSUFFLATION 10FT LAP (TUBING) ×2 IMPLANT

## 2012-03-31 NOTE — H&P (View-Only) (Signed)
 Re:   Shannon Hobbs DOB:   03/06/1956 MRN:   [id number]  ASSESSMENT AND PLAN: 1.  Morbid obesity.  Weight 319, BMI - 54.9.  Per the 1991 NIH Consensus Statement, the patient is a candidate for bariatric surgery.    The patient is interested in the laparoscopic adjustable gastric band.  I discussed with the patient the indications and risks of lap band surgery.  The potential risks of surgery include, but are not limited to, bleeding, infection, DVT and PE, slippage and erosion of the band, open surgery, and death.  The patient understands the importance of compliance and long term follow-up with our group after surgery.  We talked about that she had gained about 5 pounds - but she said that she quit smoking.  She was supposed to have brought her husband, but didn't.  She is for surgery 03/31/2012.  She is ready for the surgery and I answered her questions.   2.  Hypertension. 3.  Rheumatoid arthritis -  At least 20 years.    Was followed by Dr. Rowe, now followed by Dr. Anderson.  She has an appt with Dr. Anderson about one month post op to maybe wean the prednisone 4.  Chronic steroids - prednisone 10 mg qd x 2 years.  I discussed some of the problems with steroids in weight loss surgery - less weight loss, increased risks of erosion, and increased risks of infection. 5.  History of splenectomy/ectopic pregnancy. 6.  Quit smoking - Feb 2013. 7.  Chronic ibuprofen use.  Discussed the pros and cons with a lap band. 8.  Chronic leukocytosis - no signs of infection.  On chronic steroids and has had a splenectomy. DN 03/30/2012]  Chief Complaint  Patient presents with  . Pre-op Exam    lap band   REFERRING PHYSICIAN: HAGUE, IMRAN P, MD  HISTORY OF PRESENT ILLNESS: Shannon Hobbs is a 55 y.o. (DOB: 09/26/1955)  white female whose primary care physician is HAGUE, IMRAN P, MD and comes to me today for pre op visit for lap band.  I again went over the lap band with her.  The biggest question  is how much the prior splenectomy will get in the way.  I told her that there is a small chance that I may not be able to place the lap band.  We also talked about the risks of NSAIDs and steroids and the lap band.  UGI - 09/23/2011 - negative. Psych eval - Dr. Scott Cunningham - 01/09/2012  She had an ectopic pregnancy.  She ended up getting a splenectomy, appendectomy, in addition to managing the ectopic. No history of stomach disease.  No history of liver disease.  Lap chole - 2008 in [place].  No history of pancreas disease.  No history of colon disease.    Past Medical History  Diagnosis Date  . Arthritis   . Hypertension   . Obesity   . Dysmetabolic syndrome       Past Surgical History  Procedure Date  . Dermoid cyst  excision   . Ectopic pregnancy surgery   . Appendectomy   . Cholecystectomy   . Splenectomy, total   . Breath tek h pylori 08/29/2011    Procedure: BREATH TEK H PYLORI;  Surgeon: Viktorya Arguijo H Sheng Pritz, MD;  Location: [facility];  Service: General;  Laterality: N/A;      Current Outpatient Prescriptions  Medication Sig Dispense Refill  . hydrochlorothiazide (HYDRODIURIL) 25 MG tablet Take 25 mg   by mouth daily after breakfast.       . ibuprofen (ADVIL,MOTRIN) 200 MG tablet Take 400 mg by mouth 2 (two) times daily.       . methotrexate (RHEUMATREX) 2.5 MG tablet Take 20 mg by mouth once a week. Caution:Chemotherapy. Protect from light.      . predniSONE (DELTASONE) 10 MG tablet Take 5-10 mg by mouth daily. Takes 5 mg one day and 10 mg the next          Allergies  Allergen Reactions  . Dilaudid (Hydromorphone Hcl) Nausea And Vomiting  . Percocet (Oxycodone-Acetaminophen) Nausea And Vomiting    REVIEW OF SYSTEMS: Cardiac:  Hypertension x 2 years.  But she admits that she is not good about taking her meds.  Gastrointestinal: See HPI. Urologic:  No history of kidney stones.  No history of bladder infections. Musculoskeletal:  History of rheumatoid arthritis.  On  methotrexate/prednisone.  Followed by Dr. Shane Anderson.   SOCIAL and FAMILY HISTORY: Recently married. On disability since 2007 secondary to rheumatoid arthritis.  PHYSICAL EXAM: BP 138/64  Pulse 60  Temp 96.8 F (36 C) (Oral)  Resp 20  Ht 5' 4" (1.626 m)  Wt 319 lb 12.8 oz (145.06 kg)  BMI 54.89 kg/m2  General: WN morbidly obese WF who is alert and generally healthy appearing.  HEENT: Normal. Pupils equal. Good dentition. Neck: Supple. No mass.  No thyroid mass.  Carotid pulse okay with no bruit. Lymph Nodes:  No supraclavicular or cervical nodes. Lungs: Clear to auscultation and symmetric breath sounds. Heart:  RRR. No murmur or rub.  Abdomen: Soft. No mass. No tenderness. No hernia. Normal bowel sounds.  Long midline (or to left of midline) incision.  She said they had no trouble with her lap chole.  She is more apple than pear.  She has a moderate panus with a superficial dermatitis below the panus. Rectal: No mass. Extremities:  Good strength and ROM  in upper and lower extremities. Neurologic:  Grossly intact to motor and sensory function. Psychiatric: Has normal mood and affect. Behavior is normal.   DATA REVIEWED: See HPI  Marlies Ligman, MD,  F[facility] [address]  [address]   [place], [place]    [address] Phone:  [phone] FAX:  [phone]  

## 2012-03-31 NOTE — Anesthesia Postprocedure Evaluation (Signed)
Anesthesia Post Note  Patient: Shannon Hobbs  Procedure(s) Performed: Procedure(s) (LRB): LAPAROSCOPIC GASTRIC BANDING (N/A)  Anesthesia type: General  Patient location: PACU  Post pain: Pain level controlled  Post assessment: Post-op Vital signs reviewed  Last Vitals: BP 143/92  Pulse 98  Temp 36.6 C  Resp 12  Wt 315 lb 8 oz (143.11 kg)  SpO2 95%  Post vital signs: Reviewed  Level of consciousness: sedated  Complications: No apparent anesthesia complications

## 2012-03-31 NOTE — Interval H&P Note (Signed)
History and Physical Interval Note:  03/31/2012 2:18 PM  Shannon Hobbs  has presented today for surgery, with the diagnosis of morbid obesity   The various methods of treatment have been discussed with the patient and family. Husband at bedside.  Discussed WBC with patient and she said that she has been told her WBC was elevated before.  After consideration of risks, benefits and other options for treatment, the patient has consented to  Procedure(s) (LRB) with comments: LAPAROSCOPIC GASTRIC BANDING (N/A) as a surgical intervention .    The patient's history has been reviewed, patient examined, no change in status, stable for surgery.  I have reviewed the patient's chart and labs.  Questions were answered to the patient's satisfaction.     [name]

## 2012-03-31 NOTE — Anesthesia Preprocedure Evaluation (Addendum)
Anesthesia Evaluation  Patient identified by MRN, date of birth, ID band Patient awake    Reviewed: Allergy & Precautions, H&P , NPO status , Patient's Chart, lab work & pertinent test results  Airway Mallampati: III TM Distance: <3 FB Neck ROM: Full    Dental  (+) Edentulous Upper, Edentulous Lower and Dental Advisory Given   Pulmonary neg pulmonary ROS,  breath sounds clear to auscultation  Pulmonary exam normal       Cardiovascular hypertension, Pt. on medications Rhythm:Regular Rate:Normal     Neuro/Psych negative neurological ROS  negative psych ROS   GI/Hepatic negative GI ROS, Neg liver ROS,   Endo/Other  Morbid obesity  Renal/GU negative Renal ROS  negative genitourinary   Musculoskeletal negative musculoskeletal ROS (+)   Abdominal (+) + obese,   Peds negative pediatric ROS (+)  Hematology negative hematology ROS (+)   Anesthesia Other Findings   Reproductive/Obstetrics negative OB ROS                          Anesthesia Physical Anesthesia Plan  ASA: III  Anesthesia Plan: General   Post-op Pain Management:    Induction: Intravenous  Airway Management Planned: Oral ETT  Additional Equipment:   Intra-op Plan:   Post-operative Plan: Extubation in OR  Informed Consent: I have reviewed the patients History and Physical, chart, labs and discussed the procedure including the risks, benefits and alternatives for the proposed anesthesia with the patient or authorized representative who has indicated his/her understanding and acceptance.   Dental advisory given  Plan Discussed with: CRNA and Surgeon  Anesthesia Plan Comments:         Anesthesia Quick Evaluation

## 2012-03-31 NOTE — Anesthesia Procedure Notes (Signed)
Procedure Name: Intubation Date/Time: 03/31/2012 3:07 PM Performed by: Randon Goldsmith CATHERINE PAYNE Pre-anesthesia Checklist: Patient identified, Emergency Drugs available, Suction available, Patient being monitored and Timeout performed Patient Re-evaluated:Patient Re-evaluated prior to inductionOxygen Delivery Method: Circle system utilized Preoxygenation: Pre-oxygenation with 100% oxygen Intubation Type: IV induction Ventilation: Mask ventilation without difficulty Laryngoscope Size: Miller and 2 Grade View: Grade I Tube type: Oral Number of attempts: 1 Airway Equipment and Method: Stylet and Patient positioned with wedge pillow Placement Confirmation: ETT inserted through vocal cords under direct vision and positive ETCO2 Secured at: 22 cm Tube secured with: Tape Dental Injury: Teeth and Oropharynx as per pre-operative assessment

## 2012-03-31 NOTE — Preoperative (Signed)
Beta Blockers   Reason not to administer Beta Blockers:Not Applicable, not on home BB 

## 2012-03-31 NOTE — Transfer of Care (Signed)
Immediate Anesthesia Transfer of Care Note  Patient: Shannon Hobbs  Procedure(s) Performed: Procedure(s) (LRB) with comments: LAPAROSCOPIC GASTRIC BANDING (N/A) - EXCISION ABDOMINAL SKIN TAGS  Patient Location: PACU  Anesthesia Type: General  Level of Consciousness: awake, alert , patient cooperative and responds to stimulation  Airway & Oxygen Therapy: Patient Spontanous Breathing and Patient connected to face mask oxygen  Post-op Assessment: Report given to PACU RN, Post -op Vital signs reviewed and stable and Patient moving all extremities  Post vital signs: Reviewed and stable  Complications: No apparent anesthesia complications

## 2012-03-31 NOTE — Op Note (Signed)
03/31/2012  5:35 PM  PATIENT:  Shannon Hobbs, 56 y.o., female, MRN: [id number]  PREOP DIAGNOSIS:  morbid obesity   POSTOP DIAGNOSIS:   Morbid obesity , Weight - 319, BMI - 54.9, Intra-abdominal adhesions.  PROCEDURE:   Procedure(s): LAPAROSCOPIC GASTRIC BANDING, AP Large, Enterolysis of adhesions (30 minutes) (total of 7 trocars), removal of RUQ skin tag  SURGEON:   Ovidio Kin, M.D.  ASSISTANT:   Orlan Leavens, M.D.  ANESTHESIA:   general  Randon Goldsmith - CRNA Gaylan Gerold, MD - Anesthesiologist  General  EBL:  Minimal  ml  BLOOD ADMINISTERED: none  LOCAL MEDICATIONS USED:   30 cc 1/4% marcaine  SPECIMEN:   None  COUNTS CORRECT:  YES  INDICATIONS FOR PROCEDURE:  ARLETT GOOLD is a 56 y.o. (DOB: 1955/11/04) white female whose primary care physician is HAGUE, Myrene Galas, MD and comes for adjustable laparoscopic gastric band placement.   The indications and risks of the surgery were explained to the patient.  The risks include, but are not limited to, infection, bleeding, bowel injury and failure to lose weight.  Long term risk include, but are not limited to, slippage and erosion of the lap band.  OPERATIVE NOTE:  The patient was taken to room #11 at [facility] for the operation.  General was the supervising anesthesiologist.  The patient's abdomen was prepped with Chloroprep and sterilely draped.  The patient was given Ancef at the beginning of the operation.   A time out was held and the surgical checklist reviewed.   The abdomen was accessed in the RUQ with an 5 mm trocar.  The patient had had a prior left paramedian incision for a splenectomy and I tried to avoid this part of her intra-abdominal cavity.  I placed a second 5 mm trocar in the right mid abdomen.  I spent over 30 minutes taking down adhesion to the anterior abdominal wall to get to the left upper quadrant of the peritoneal cavity and access the stomach.  Five additional trocars were placed: a 5 mm trocar in the subxiphoid  area for the liver retractor, a 15 mm trocar in the right costal margin, a 12 mm trocar to the right of midline, a 12 mm trocar to the left of midline and a 5 mm trocar in the left lateral subcostal area.  A total of 7 trocars were used during the operation.   An abdominal exploration was carried out.  The liver was unremarkable, though had some adhesions from her prior surgery.  The stomach was unremarkable.  The remainder of the bowel, which could be seen, was unremarkable.  The adhesions did not effect getting to her esophago-gastric junction or seeing the landmarks for placement of the lap band.   I made an incision at the Angle of Hiss and identified the left crus.  I then opened the gastrohepatic ligament over the caudate lobe.  I identified the right crus and passed the "finger" dissector behind the gastroesophageal junction.   I then used a AP Large Lap Band and inserted the silastic tubing through the tip of the finger dissector and pulled the tubing around the proximal stomach.  The sizing tube was passed into the stomach and the lap band closed over the sizing tube.  The sizing tube was removed.   I then imbricated the lateral stomach over the lap band and sewed it to the stomach proximal to the lap band.  I used 2-0 Ethibond sutures with Laparo-tyes to do the  gastro-gastric suture.  Photos were taken and placed in the chart.  The lap band and tubing lay in good position.  The tubing was brought through the right lateral trocar site and attached to the lap band reservoir.  The reservoir has a piece of polypropylene mesh sewn on the back to fixate it in the subcutaneous tissues.   Each incision was sewn with 5-0 Monocryl sutures and the skin was painted with Dermabond.  The patient tolerated the procedure well and was sent to the recovery room in good condition.  The sponge and needle count were correct at the end of the case.  Ovidio Kin, MD, [facility] Pager:  [phone] Office phone:  [phone]

## 2012-04-01 ENCOUNTER — Ambulatory Visit (HOSPITAL_COMMUNITY): Payer: Medicare Other

## 2012-04-01 ENCOUNTER — Encounter (HOSPITAL_COMMUNITY): Payer: Self-pay | Admitting: Surgery

## 2012-04-01 MED ORDER — FLEET ENEMA 7-19 GM/118ML RE ENEM: 1.0000 | ENEMA | Freq: Once | RECTAL | Status: DC

## 2012-04-01 MED ORDER — BISACODYL 10 MG RE SUPP
10.0000 mg | Freq: Every day | RECTAL | Status: DC | PRN
  Administered 2012-04-01: 10 mg via RECTAL
  Filled 2012-04-01: qty 1
  Filled 2012-04-01: qty 2

## 2012-04-01 MED ORDER — FLEET ENEMA 7-19 GM/118ML RE ENEM
1.0000 | ENEMA | Freq: Once | RECTAL | Status: AC | PRN
  Filled 2012-04-01: qty 1

## 2012-04-01 MED ORDER — OXYCODONE-ACETAMINOPHEN 5-325 MG/5ML PO SOLN: 5.0000 mL | ORAL | Status: DC | PRN

## 2012-04-01 NOTE — Progress Notes (Signed)
General Surgery Note  LOS: 1 day  POD# 1   Assessment/Plan: 1.  LAPAROSCOPIC GASTRIC BANDING (AP large), enterolysis, removal of skin tag - D. Habeeb Puertas - 04/01/2012.  Doing okay.  Ready for d/c.  Instructions reviewed.  2. Hypertension.  3. Rheumatoid arthritis - At least 20 years.   Was followed by Dr. Phylliss Bob, now followed by Dr. Dareen Piano.  She has an appt with Dr. Dareen Piano about one month post op to maybe wean the prednisone  4. Chronic steroids - prednisone 10 mg qd x 2 years.   I discussed some of the problems with steroids in weight loss surgery - less weight loss, increased risks of erosion, and increased risks of infection.  5. History of splenectomy/ectopic pregnancy.  6. Quit smoking - Feb 2013.  7. Chronic ibuprofen use.   Discussed the pros and risks with a lap band.   She is going to try to limit their use when she goes home. 8. 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   Subjective:  Has walked and tolerated water.  To take protein drink this am and then to go home. Objective:   Filed Vitals:   04/01/12 0619  BP: 137/84  Pulse: 88  Temp: 97.7 F (36.5 C)  Resp: 18     Intake/Output from previous day:  09/03 0701 - 09/04 0700 In: 700 [I.V.:600; IV Piggyback:100] Out: 550 [Urine:550]  Intake/Output this shift:      Physical Exam:   General: WN obese WF who is alert and oriented.    HEENT: Normal. Pupils equal. .   Lungs: Clear.   Abdomen: Soft.  She said there was some bleeding earlier, but not now.   Wound: Look okay.   Neurologic:  Grossly intact to motor and sensory function.   Psychiatric: Has normal mood and affect.   Lab Results:   No results found for this basename: WBC:2,HGB:2,HCT:2,PLT:2 in the last 72 hours  BMET  No results found for this basename: NA:2,K:2,CL:2,CO2:2,GLUCOSE:2,BUN:2,CREATININE:2,CALCIUM:2 in the last 72 hours  PT/INR  No results found for this basename: LABPROT:2,INR:2 in the last 72  hours  ABG  No results found for this basename: PHART:2,PCO2:2,PO2:2,HCO3:2 in the last 72 hours   Studies/Results:  No results found.   Anti-infectives:   Anti-infectives     Start     Dose/Rate Route Frequency Ordered Stop   03/31/12 1319   ceFAZolin (ANCEF) 3 g in dextrose 5 % 50 mL IVPB        3 g 160 mL/hr over 30 Minutes Intravenous 60 min pre-op 03/31/12 1319 03/31/12 1511          Ovidio Kin, MD, FACS Pager: [phone],   [facility] Office: [phone] 04/01/2012

## 2012-04-01 NOTE — Progress Notes (Signed)
Patient alert and oriented. Vital signs stable. Ambulating well. Did have an episode of nausea and vomited water after a small sip. Will continue to observe and medicate for nausea ADJUSTABLE GASTRIC BAND DISCHARGE INSTRUCTIONS  Drs. Fredrik Rigger, Hoxworth, Lawton Dollinger, and [place]  Call if you have any problems.   Call [phone] and ask for the surgeon on call.    If you need immediate assistance come to the ER at [facility]. Tell the ER personnel that you are a new post-op gastric banding patient. Signs and symptoms to report:   Severe vomiting or nausea. If you cannot tolerate clear liquids for longer than 1 day, you need to call your surgeon.    Abdominal pain which does not get better after taking your pain medication   Fever greater than 101 F degree   Difficulty breathing   Chest pain    Redness, swelling, drainage, or foul odor at incision sites    If your incisions open or pull apart   Swelling or pain in calf (lower leg)   Diarrhea, frequent watery, uncontrolled bowel movements.   Constipation, (no bowel movements for 3 days) if this occurs, Take Milk of Magnesia, 2 tablespoons by mouth, 3 times a day for 2 days if needed.  Call your doctor if constipation continues. Stop taking Milk of Magnesia once you have had a bowel movement. You may also use Miralax according to the label instructions.   Anything you consider "abnormal for you".   Normal side effects after Surgery:   Unable to sleep at night or concentrate   Irritability   Being tearful (crying) or depressed   These are common complaints, possibly related to your anesthesia, stress of surgery and change in lifestyle, that usually go away a few weeks after surgery.  If these feelings continue, call your medical doctor.  Wound Care You may have surgical glue, steri-strips, or staples over your incisions after surgery.  Surgical glue:  Looks like a clear film over your incisions and will wear off gradually. Steri-strips:  Strips of tape over your incisions. You may notice a yellowish color on the skin underneath the steri-strips. This is a substance used to make the steri-strips stick better. Do not pull the steri-strips off - let them fall off. Staples: Cherlynn Polo may be removed before you leave the hospital. If you go home with staples, call [facility] [phone]) for an appointment with your surgeon's nurse to have staples removed in 7 - 10 days. Showering: You may shower two days after your surgery unless otherwise instructed by your surgeon. Wash gently around wounds with warm soapy water, rinse well, and gently pat dry.  If you have a drain, you may need someone to hold this while you shower. Avoid tub baths until staples are removed and incisions are healed.    Medications   Medications should be liquid or crushed if larger than the size of a dime.  Extended release pills should not be crushed.   Depending on the size and number of medications you take, you may need to stagger/change the time you take your medications so that you do not over-fill your pouch.    Make sure you follow-up with your primary care physician to make medication adjustments needed during rapid weight loss and life-style adjustment.   If you are diabetic, follow up with the doctor that prescribes your diabetes medication(s) within one week after surgery and check your blood sugar regularly.   Do not drive while taking  narcotics!   Do not take acetaminophen (Tylenol) and Roxicet or Lortab Elixir at the same time since these pain medications contain acetaminophen.  Diet at home: (First 2 Weeks)  You will see the nutritionist two weeks after your surgery. She will advance your diet if you are tolerating liquids well. Once at home, if you have severe vomiting or nausea and cannot tolerate clear liquids lasting longer than 1 day, call your surgeon.  For Same Day Surgery Discharge Patients: The day of surgery drink water only: 2  ounces every 4 hours. If you are tolerating water, begin drinking your high protein shake the next morning. For Overnight Stay Patients: Begin high protein shake 2 ounces every 3 hours, 5 - 6 times per day.  Gradually increase the amount you drink as tolerated.  You may find it easier to slowly sip shakes throughout the day.  It is important to get your proteins in first.   Protein Shake   Drink at least 2 ounces of shake 5-6 times per day   Each serving of protein shakes should have a minimum of 15 grams of protein and no more than 5 grams of carbohydrate    Increase the amount of protein shake you drink as tolerated   Protein powder may be added to fluids such as non-fat milk or Lactaid milk (limit to 20 grams added protein powder per serving   The initial goal is to drink at least 8 ounces of protein shake/drink per day (or as directed by the nutritionist). Some examples of protein shakes are [employer], [employer], EAS Edge HP, and Unjury. Hydration   Gradually increase the amount of water and other liquids as tolerated (See Acceptable Fluids)   Gradually increase the amount of protein shake as tolerated     Sip fluids slowly and throughout the day   May use Sugar substitutes, use sparingly (limit to 6 - 8 packets per day).  Your fluid goal is 64 ounces of fluid daily. It may take a few weeks to build up to this.         32 oz (or more) should be clear liquids and 32 oz (or more) should be full liquids.         Liquids should not contain sugar, caffeine, or carbonation!  Acceptable Fluids Clear Liquids:   Water or Sugar-free flavored water, Fruit H2O   Decaffeinated coffee or tea (sugar-free)   Crystal Lite, Wyler's Lite, Minute Maid Lite   Sugar-free Jell-O   Bouillon or broth   Sugar-free Popsicle:   *Less than 20 calories each; Limit 1 per day   Full Liquids:              Protein Shakes/Drinks + 2 choices per day of other full liquids shown below.    Other full liquids  must be: No more than 12 grams of Carbs per serving,  No more than 3 grams of Fat per serving   Strained low-fat cream soup   Non-Fat milk   Fat-free Lactaid Milk   Sugar-free yogurt (Dannon Lite & Fit) Vitamins and Minerals (Start 1 day after surgery unless otherwise directed)   1 Chewable Multivitamin / Multimineral Supplement (i.e. Centrum for Adults)   Chewable Calcium Citrate with Vitamin D-3. Take 1500 mg each day.           (Example: 3 Chewable Calcium Plus 600 with Vitamin D-3 can be found at [facility])           Do  not mix multivitamins containing iron with calcium supplements; take 2 hours   apart   Do not substitute Tums (calcium carbonate) for your calcium   Menstruating women and those at risk for anemia may need extra iron. Talk with your doctor to see if you need additional iron.     If you need extra iron:  Total daily Iron recommendations (including Vitamins) = 50 - 100 mg Iron/day Do not stop taking or change any vitamins or minerals until you talk to your nutritionist or surgeon. Your nutritionist and / or physician must approve all vitamin and mineral supplements. Exercise For maximum success, begin exercising as soon as your doctor recommends. Make sure your physician approves any physical activity.   Depending on fitness level, begin with a simple walking program   Walk 5-15 minutes each day, 7 days per week.    Slowly increase until you are walking 30-45 minutes per day   Consider joining our BELT program. [phone] or email [email or web address] [email or web address] Things to remember:   You may have sexual relations when you feel comfortable. It is VERY important for female patients to use a reliable birth control method. Fertility often increases after surgery. Do not get pregnant for at least 18 months.   It is very important to keep all follow up appointments with your surgeon, nutritionist, primary care physician, and behavioral health practitioner. After the first year, please follow up with  your bariatric surgeon at least once a year in order to maintain best weight loss results.  [facility]: [phone] [facility]: [phone]   Free counseling is available for you and your family through collaboration between [facility] and [place]. Please call [phone] and leave a message.    Consider purchasing a medical alert bracelet that says you had lap-band surgery.    The [facility] has a free Bariatric Surgery Support Group that meets monthly, the 3rd Thursday, 6 pm, Classroom #1, [employer]. You may register online at [email or web address], but registration is not necessary. Select Classes and Support Groups, Bariatric Surgery, or Call [phone]   Do not return to work or drive until cleared by your surgeon   Use your CPAP when sleeping if applicable   Do not lift anything greater than ten pounds for at least two weeks.   You will probably have your first fill (fluid added to your band) 6 weeks after surgery       . In anticipation of discharge possibly later today, provided patient bariatric discharge instructions (see below). Patient's questions answered and patient able to articulate understanding. Post-op appointments in place with Dr. Ezzard Standing and the nutritionist on 04/15/12. Paged Dr. Ezzard Standing to give update on patient status.

## 2012-04-01 NOTE — Progress Notes (Signed)
Dr. Ezzard Standing notified of patient's episode of vomiting. To delay discharge until after 2 pm to ensure patient tolerates 2 protein drinks. Notified nursing.

## 2012-04-02 NOTE — Progress Notes (Signed)
General Surgery Note  LOS: 2 days  POD# 2   Assessment/Plan: 1.  LAPAROSCOPIC GASTRIC BANDING (AP large), enterolysis, removal of skin tag - D. Murry Diaz - 03/31/2012.  D/C delayed yesterday because of nausea/vomiting.  Doing much better today.  Ready for discharge.  #[id number]  2. Hypertension.  3. Rheumatoid arthritis - At least 20 years.   Was followed by Dr. Phylliss Bob, now followed by Dr. Dareen Piano.  She has an appt with Dr. Dareen Piano about one month post op to maybe wean the prednisone  4. Chronic steroids - prednisone 10 mg qd x 2 years.   I discussed some of the problems with steroids in weight loss surgery - less weight loss, increased risks of erosion, and increased risks of infection.  5. History of splenectomy/ectopic pregnancy.  6. Quit smoking - Feb 2013.  7. Chronic ibuprofen use.   Discussed the pros and risks with a lap band.   She is going to try to limit their use when she goes home. 8. 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   Subjective:  She vomited after I saw her yesterday.  Now better. She has tolerated the protein drinks.  Husband in the room.  Objective:   Filed Vitals:   04/02/12 0521  BP: 123/76  Pulse: 91  Temp: 98.5 F (36.9 C)  Resp: 16     Intake/Output from previous day:  04/06/2023 0701 - 09/05 0700 In: 0  Out: 450 [Urine:450]  Intake/Output this shift:      Physical Exam:   General: WN obese WF who is alert and oriented.    HEENT: Normal. Pupils equal. .   Lungs: Clear.   Abdomen: Soft.  She said there was some bleeding earlier, but not now.   Wound: Look okay, some bruising around the incisions.   Neurologic:  Grossly intact to motor and sensory function.   Psychiatric: Has normal mood and affect.   Lab Results:   No results found for this basename: WBC:2,HGB:2,HCT:2,PLT:2 in the last 72 hours  BMET  No results found for this basename: NA:2,K:2,CL:2,CO2:2,GLUCOSE:2,BUN:2,CREATININE:2,CALCIUM:2 in the last 72  hours  PT/INR  No results found for this basename: LABPROT:2,INR:2 in the last 72 hours  ABG  No results found for this basename: PHART:2,PCO2:2,PO2:2,HCO3:2 in the last 72 hours   Studies/Results:  Dg Abd 1 View  April 05, 2012  *RADIOLOGY REPORT*  Clinical Data: Postop lap band procedure  ABDOMEN - 1 VIEW  Comparison: Upper GI study dated 09/23/2011  Findings: Single frontal view of the abdomen.  A lap band projects over the region of the gastroesophageal junction.  The subcutaneous port reservoir projects over the right hemiabdomen.  The connecting tubing appears intact.  Visualized bowel gas pattern is unremarkable, and not obstructed.  Linear opacities the left base are favored to reflect atelectasis.  No acute osseous abnormality.  IMPRESSION:  Expected post postsurgical changes consistent with lap band procedure.  No evidence of immediate complication.   Original Report Authenticated By: Vilma Prader      Anti-infectives:   Anti-infectives     Start     Dose/Rate Route Frequency Ordered Stop   03/31/12 1319   ceFAZolin (ANCEF) 3 g in dextrose 5 % 50 mL IVPB        3 g 160 mL/hr over 30 Minutes Intravenous 60 min pre-op 03/31/12 1319 03/31/12 1511          Ovidio Kin, MD, FACS Pager: [phone],   [facility] Office: [phone]  04/02/2012   

## 2012-04-02 NOTE — Progress Notes (Signed)
Pt alert and oriented; VSS; states she is ready for discharge; denies any nausea or vomiting; tolerating liquids and protein shakes well; +flatus; +BM; voiding without difficulty; ambulating without difficulty; using incentive spirometer;  Denies pain; pt already has follow up appts with [facility] and [facility]; pt aware of BELT program and support group; discharge instructions reviewed and pt verbalized understanding of; questions answered. ADJUSTABLE GASTRIC BAND DISCHARGE INSTRUCTIONS  Drs. Fredrik Rigger, Hoxworth, Wilson, and Blanding Call if you have any problems.   Call [phone] and ask for the surgeon on call.    If you need immediate assistance come to the ER at [facility]. Tell the ER personnel that you are a new post-op gastric banding patient. Signs and symptoms to report:   Severe vomiting or nausea. If you cannot tolerate clear liquids for longer than 1 day, you need to call your surgeon.    Abdominal pain which does not get better after taking your pain medication   Fever greater than 101 F degree   Difficulty breathing   Chest pain    Redness, swelling, drainage, or foul odor at incision sites    If your incisions open or pull apart   Swelling or pain in calf (lower leg)   Diarrhea, frequent watery, uncontrolled bowel movements.   Constipation, (no bowel movements for 3 days) if this occurs, Take Milk of Magnesia, 2 tablespoons by mouth, 3 times a day for 2 days if needed.  Call your doctor if constipation continues. Stop taking Milk of Magnesia once you have had a bowel movement. You may also use Miralax according to the label instructions.   Anything you consider "abnormal for you".   Normal side effects after Surgery:   Unable to sleep at night or concentrate   Irritability   Being tearful (crying) or depressed   These are common complaints, possibly related to your anesthesia, stress of surgery and change in lifestyle, that usually go away a few weeks after surgery.  If these  feelings continue, call your medical doctor.  Wound Care You may have surgical glue, steri-strips, or staples over your incisions after surgery.  Surgical glue:  Looks like a clear film over your incisions and will wear off gradually. Steri-strips: Strips of tape over your incisions. You may notice a yellowish color on the skin underneath the steri-strips. This is a substance used to make the steri-strips stick better. Do not pull the steri-strips off - let them fall off. Staples: Cherlynn Polo may be removed before you leave the hospital. If you go home with staples, call [facility] [phone]) for an appointment with your surgeon's nurse to have staples removed in 7 - 10 days. Showering: You may shower two days after your surgery unless otherwise instructed by your surgeon. Wash gently around wounds with warm soapy water, rinse well, and gently pat dry.  If you have a drain, you may need someone to hold this while you shower. Avoid tub baths until staples are removed and incisions are healed.    Medications   Medications should be liquid or crushed if larger than the size of a dime.  Extended release pills should not be crushed.   Depending on the size and number of medications you take, you may need to stagger/change the time you take your medications so that you do not over-fill your pouch.    Make sure you follow-up with your primary care physician to make medication adjustments needed during rapid weight loss and life-style adjustment.  If you are diabetic, follow up with the doctor that prescribes your diabetes medication(s) within one week after surgery and check your blood sugar regularly.   Do not drive while taking narcotics!   Do not take acetaminophen (Tylenol) and Roxicet or Lortab Elixir at the same time since these pain medications contain acetaminophen.  Diet at home: (First 2 Weeks)  You will see the nutritionist two weeks after your surgery. She will advance your  diet if you are tolerating liquids well. Once at home, if you have severe vomiting or nausea and cannot tolerate clear liquids lasting longer than 1 day, call your surgeon.  For Same Day Surgery Discharge Patients: The day of surgery drink water only: 2 ounces every 4 hours. If you are tolerating water, begin drinking your high protein shake the next morning. For Overnight Stay Patients: Begin high protein shake 2 ounces every 3 hours, 5 - 6 times per day.  Gradually increase the amount you drink as tolerated.  You may find it easier to slowly sip shakes throughout the day.  It is important to get your proteins in first.   Protein Shake   Drink at least 2 ounces of shake 5-6 times per day   Each serving of protein shakes should have a minimum of 15 grams of protein and no more than 5 grams of carbohydrate    Increase the amount of protein shake you drink as tolerated   Protein powder may be added to fluids such as non-fat milk or Lactaid milk (limit to 20 grams added protein powder per serving   The initial goal is to drink at least 8 ounces of protein shake/drink per day (or as directed by the nutritionist). Some examples of protein shakes are [employer], [employer]illard[employer], EAS Edge HP, and Unjury. Hydration   Gradually increase the amount of water and other liquids as tolerated (See Acceptable Fluids)   Gradually increase the amount of protein shake as tolerated     Sip fluids slowly and throughout the day   May use Sugar substitutes, use sparingly (limit to 6 - 8 packets per day).  Your fluid goal is 64 ounces of fluid daily. It may take a few weeks to build up to this.         32 oz (or more) should be clear liquids and 32 oz (or more) should be full liquids.         Liquids should not contain sugar, caffeine, or carbonation!  Acceptable Fluids Clear Liquids:   Water or Sugar-free flavored water, Fruit H2O   Decaffeinated coffee or tea (sugar-free)   Crystal Lite, Wyler's Lite, Minute  Maid Lite   Sugar-free Jell-O   Bouillon or broth   Sugar-free Popsicle:   *Less than 20 calories each; Limit 1 per day   Full Liquids:              Protein Shakes/Drinks + 2 choices per day of other full liquids shown below.    Other full liquids must be: No more than 12 grams of Carbs per serving,  No more than 3 grams of Fat per serving   Strained low-fat cream soup   Non-Fat milk   Fat-free Lactaid Milk   Sugar-free yogurt (Dannon Lite & Fit) Vitamins and Minerals (Start 1 day after surgery unless otherwise directed)   1 Chewable Multivitamin / Multimineral Supplement (i.e. Centrum for Adults)   Chewable Calcium Citrate with Vitamin D-3. Take 1500 mg each day.           (  Example: 3 Chewable Calcium Plus 600 with Vitamin D-3 can be found at [facility])           Do not mix multivitamins containing iron with calcium supplements; take 2 hours   apart   Do not substitute Tums (calcium carbonate) for your calcium   Menstruating women and those at risk for anemia may need extra iron. Talk with your doctor to see if you need additional iron.     If you need extra iron:  Total daily Iron recommendations (including Vitamins) = 50 - 100 mg Iron/day Do not stop taking or change any vitamins or minerals until you talk to your nutritionist or surgeon. Your nutritionist and / or physician must approve all vitamin and mineral supplements. Exercise For maximum success, begin exercising as soon as your doctor recommends. Make sure your physician approves any physical activity.   Depending on fitness level, begin with a simple walking program   Walk 5-15 minutes each day, 7 days per week.    Slowly increase until you are walking 30-45 minutes per day   Consider joining our BELT program. [phone] or email belt@[employer] .edu Things to remember:   You may have sexual relations when you feel comfortable. It is VERY important for female patients to use a reliable birth control method. Fertility often increases  after surgery. Do not get pregnant for at least 18 months.   It is very important to keep all follow up appointments with your surgeon, nutritionist, primary care physician, and behavioral health practitioner. After the first year, please follow up with your bariatric surgeon at least once a year in order to maintain best weight loss results.  [facility]: [phone] [facility]: [phone]   Free counseling is available for you and your family through collaboration between [facility] and [place]. Please call [phone] and leave a message.    Consider purchasing a medical alert bracelet that says you had lap-band surgery.    [facility] has a free Bariatric Surgery Support Group that meets monthly, the 3rd Thursday, 6 pm, Classroom #1, [place]. You may register online at [email or web address], but registration is not necessary. Select Classes and Support Groups, Bariatric Surgery, or Call [phone]   Do not return to work or drive until cleared by your surgeon   Use your CPAP when sleeping if applicable   Do not lift anything greater than ten pounds for at least two weeks.   You will probably have your first fill (fluid added to your band) 6 weeks after surgery   Talmadge Chad, RN BAriatric Nurse Coordinator

## 2012-04-03 NOTE — Discharge Summary (Signed)
Shannon Hobbs, Shannon Hobbs                  ACCOUNT NO.:  [id number]  MEDICAL RECORD NO.:  [id number]  LOCATION:  1524                         FACILITY:  [facility]  PHYSICIAN:  Sandria Bales. Ezzard Standing, M.D.  DATE OF BIRTH:  May 16, 1956  DATE OF ADMISSION:  03/31/2012 DATE OF DISCHARGE:  04/02/2012                              DISCHARGE SUMMARY  DISCHARGE DIAGNOSES: 1. Morbid obesity with a weight of 319 and BMI of 54.9. 2. Hypertension. 3. Rheumatoid arthritis, followed by Dr. Dareen Piano. 4. Chronic steroid with prednisone 10 mg daily for her rheumatoid     arthritis. 5. History of splenectomy and ectopic pregnancy. 6. She says she quit smoking in February 2013. 7. Chronic ibuprofen use for rheumatoid arthritis. 8. She knows the pros and cons of this regarding her lap band. 9. Chronic leukocytosis.  OPERATION PERFORMED:  The patient underwent a laparoscopic gastric banding with an AP large band and enterolysis of adhesions and removal of skin tag on the March 31, 2012 by Dr. Algis Downs. Shannon Hobbs  HISTORY OF ILLNESS:  Shannon Hobbs is a 56 year old white female, who sees Dr. Donnel Saxon as her primary care doctor.  She has been morbidly obese much of her adult life.  She is interested in having a lap band procedure.  She has been through our preop bariatric program and come to the hospital for lap band placement.  Her significant morbid problems includes; 1. Hypertension. 2. Rheumatoid arthritis followed by Dr. Dareen Piano. 3. Chronic steroids as she has been on for at least 2 years for     rheumatoid arthritis. 4. We had a lengthy discussion about the problems with chronic     steroids and weight loss, and as much as she could restrict this     regarding her rheumatoid arthritis, the better. 5. History of splenectomy and ectopic pregnancy. 6. She quit smoking in February 2013. 7. She takes chronic ibuprofen primarily for arthritis.  Again, we     discussed the pros and cons of NSAIDs regarding stomach ulceration    and band erosion. 8. Chronic leukocytosis believed to be based on her splenectomy and     history of steroids.  HOSPITAL COURSE:  She was taken to the operating room on the day of admission where she underwent a placement of a AP large laparoscopic gastric band.  She had a moderate number of adhesions from her prior left paramedian incision, so I spent over 30 minutes taking these down.  She also had a skin tag on right upper abdominal wall that are removed.  Her postoperative course when I saw her yesterday, she had tolerated water.  Her x-ray looks good.  She is looking to ready go home, but then later that morning she vomited and had some dry heaves, and was felt it would be best to keep her at least 1 more night.  So, she is now 2 days postop.  She is afebrile.  She is tolerating her protein drinks.  Her nausea has resolved.  Her KUB showed a normal location of the lap band. Her discharge instructions were then reviewed.  DISCHARGE INSTRUCTIONS: 1. Activity:  She can drive in 2-3 days if she  is doing well and she can start lifting also 2 or 3 days with no limits. 2. Wound care: She can start showering tonight, which is 2 days postop, but she is to stay out of any kind of public water. 3. Diet:  postop lap band diet  4.  Follow up:  Knows to contact our office and nutrition to be seen in a couple of weeks.  Sandria Bales. Ezzard Standing, M.D., FACS   DHN/MEDQ  D:  04/02/2012  T:  04/03/2012  Job:  [id number]  cc:   Donnel Saxon, MD Fax: [phone]  Azzie Roup, MD

## 2012-04-15 ENCOUNTER — Ambulatory Visit (INDEPENDENT_AMBULATORY_CARE_PROVIDER_SITE_OTHER): Payer: Medicare Other | Admitting: Surgery

## 2012-04-15 ENCOUNTER — Encounter (INDEPENDENT_AMBULATORY_CARE_PROVIDER_SITE_OTHER): Payer: Self-pay | Admitting: Surgery

## 2012-04-15 ENCOUNTER — Encounter: Payer: Medicare Other | Attending: Surgery | Admitting: *Deleted

## 2012-04-15 ENCOUNTER — Encounter: Payer: Self-pay | Admitting: *Deleted

## 2012-04-15 VITALS — BP 136/80 | HR 89 | Temp 96.7°F | Resp 18 | Ht 64.0 in | Wt 304.8 lb

## 2012-04-15 DIAGNOSIS — Z713 Dietary counseling and surveillance: Secondary | ICD-10-CM | POA: Insufficient documentation

## 2012-04-15 DIAGNOSIS — Z9884 Bariatric surgery status: Secondary | ICD-10-CM

## 2012-04-15 DIAGNOSIS — Z01818 Encounter for other preprocedural examination: Secondary | ICD-10-CM | POA: Insufficient documentation

## 2012-04-15 NOTE — Progress Notes (Signed)
Re:   Shannon Hobbs DOB:   August 30, 1955 MRN:   [id number]  ASSESSMENT AND PLAN: 1.  Morbid obesity.  Weight 319, BMI - 54.9.  Lap Band - AP Large - 03/31/2012  Saw the dietitian today and she is starting on solids.  Return to see me in 8 weeks.    2.  Hypertension. 3.  Rheumatoid arthritis -  At least 20 years.    Was followed by Dr. Phylliss Bob, now followed by Dr. Dareen Piano.  She has an appt with Dr. Dareen Piano about one month post op to maybe wean the prednisone 4.  Chronic steroids - prednisone 10 mg qd x 2 years.  I discussed some of the problems with steroids in weight loss surgery - less weight loss, increased risks of erosion, and increased risks of infection. 5.  History of splenectomy/ectopic pregnancy. 6.  Quit smoking - Feb 2013. 7.  Chronic ibuprofen use.  Discussed the pros and cons with a lap band. 8.  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  9.  On disability since 2007.  Chief Complaint  Patient presents with  . Routine Post Op    lap band   REFERRING PHYSICIAN: HAGUE, Myrene Galas, MD  HISTORY OF PRESENT ILLNESS: Shannon Hobbs is a 56 y.o. (DOB: April 15, 1956)  white female whose primary care physician is HAGUE, Myrene Galas, MD and comes to me today for follow up of lap band placement.    She is in good spirits and doing well.  She is getting on the treadmill 10 minutes per day.  I have encouraged her to target 1 hour twice a day in the long run.  Psych eval - Dr. Tiajuana Amass - 01/09/2012    Past Medical History  Diagnosis Date  . Arthritis   . Hypertension   . Obesity   . Dysmetabolic syndrome       Past Surgical History  Procedure Date  . Dermoid cyst  excision   . Ectopic pregnancy surgery   . Appendectomy   . Cholecystectomy   . Splenectomy, total   . Breath tek h pylori 08/29/2011    Procedure: BREATH TEK H PYLORI;  Surgeon: Kandis Cocking, MD;  Location: [facility];  Service: General;  Laterality: N/A;  . Laparoscopic gastric  banding 03/31/2012    Procedure: LAPAROSCOPIC GASTRIC BANDING;  Surgeon: Kandis Cocking, MD;  Location: [facility];  Service: General;  Laterality: N/A;  EXCISION ABDOMINAL SKIN TAGS      Current Outpatient Prescriptions  Medication Sig Dispense Refill  . hydrochlorothiazide (HYDRODIURIL) 25 MG tablet Take 25 mg by mouth daily after breakfast.       . methotrexate (RHEUMATREX) 2.5 MG tablet Take 20 mg by mouth once a week. Caution:Chemotherapy. Protect from light.      . predniSONE (DELTASONE) 10 MG tablet Take 5-10 mg by mouth daily. Takes 5 mg one day and 10 mg the next          Allergies  Allergen Reactions  . Dilaudid (Hydromorphone Hcl) Nausea And Vomiting  . Percocet (Oxycodone-Acetaminophen) Nausea And Vomiting    REVIEW OF SYSTEMS: Cardiac:  Hypertension x 2 years.  But she admits that she is not good about taking her meds.  Gastrointestinal: She had an ectopic pregnancy.  She ended up getting a splenectomy, appendectomy, in addition to managing the ectopic. No history of stomach disease.  No history of liver disease.  Lap chole - 2008 in [place].  No history of pancreas disease.  No history of colon disease. Urologic:  No history of kidney stones.  No history of bladder infections. Musculoskeletal:  History of rheumatoid arthritis.  On methotrexate/prednisone.  Followed by Dr. Azzie Roup.   SOCIAL and FAMILY HISTORY: Recently married. On disability since 2007 secondary to rheumatoid arthritis.  PHYSICAL EXAM: BP 136/80  Pulse 89  Temp 96.7 F (35.9 C) (Oral)  Resp 18  Ht 5\' 4"  (1.626 m)  Wt 304 lb 12.8 oz (138.256 kg)  BMI 52.32 kg/m2  SpO2 94%  General: WN morbidly obese WF who is alert and generally healthy appearing.  Abdomen: Soft. No mass. No tenderness. No hernia. Normal bowel sounds.  Long midline (or to left of midline) incision.  Lap band incisions look good.  DATA REVIEWED: None new.  Ovidio Kin, MD,  [facility] [address] [place]  [address]   [place], [place]    [address] Phone:  [phone] FAX:  [phone]

## 2012-04-15 NOTE — Progress Notes (Addendum)
Bariatric Class:  Appt start time: 0930 end time:  1030.  2 Week Post-Operative Nutrition Class  Patient was seen on 04/15/2012 for Post-Operative Nutrition [facility]   Surgery date: 03/31/12 Surgery type: LAGB Start weight at [facility]: 315.4 lbs Last weight: 332.5 (03/16/12)  Weight today: 304.5 lbs Weight change: 28.0 lbs Total weight lost: 28.0 lbs BMI: 52.3 kg/m^2  TANITA  BODY COMP RESULTS  04/15/12   %Fat 57.5%   Fat Mass (lbs) 175.0   Fat Free Mass (lbs) 129.5   Total Body Water (lbs) 95.0   The following the learning objectives were met by the patient during this course:   Identifies Phase 3A (Soft, High Proteins) Dietary Goals and will begin from 2 weeks post-operatively to 2 months post-operatively  Identifies appropriate sources of fluids and proteins   States protein recommendations and appropriate sources post-operatively  Identifies the need for appropriate texture modifications, mastication, and bite sizes when consuming solids  Identifies appropriate multivitamin and calcium sources post-operatively  Describes the need for physical activity post-operatively and will follow MD recommendations  States when to call healthcare provider regarding medication questions or post-operative complications  Handouts given during class include:  Phase 3A: Soft, High Protein Diet Handout  Band Fills Handout  Follow-Up Plan: Patient will follow-up at [facility] in 6 weeks for 2 months post-op nutrition visit for diet advancement per MD.

## 2012-04-15 NOTE — Patient Instructions (Addendum)
Patient to follow Phase 3A-Soft, High Protein Diet and follow-up at [facility] in 6 weeks for 2 months post-op nutrition visit for diet advancement. 

## 2012-05-29 ENCOUNTER — Encounter: Payer: Self-pay | Admitting: *Deleted

## 2012-05-29 ENCOUNTER — Encounter: Payer: Medicare Other | Attending: Surgery | Admitting: *Deleted

## 2012-05-29 DIAGNOSIS — Z713 Dietary counseling and surveillance: Secondary | ICD-10-CM | POA: Insufficient documentation

## 2012-05-29 DIAGNOSIS — Z01818 Encounter for other preprocedural examination: Secondary | ICD-10-CM | POA: Insufficient documentation

## 2012-05-29 NOTE — Patient Instructions (Addendum)
Goals:  Follow Phase 3B: High Protein + Non-Starchy Vegetables  Eat 3-6 small meals/snacks, every 3-5 hrs  Increase lean protein foods to meet 60-80g goal  Increase fluid intake to 64oz +  Avoid drinking 15 minutes before, during and 30 minutes after eating  Aim for >30 min of physical activity daily 

## 2012-05-29 NOTE — Progress Notes (Signed)
Follow-up visit:  8 Weeks Post-Operative LAGB Surgery  Medical Nutrition Therapy:  Appt start time: 1030  End time:  1100.  Primary concerns today: Post-operative Bariatric Surgery Nutrition Management. Karisa returns today with an additional wt loss of 12 lbs. She is doing very well overall with no problems reported other than she is "starving". Discussed staying with portion sizes and adding non-starchy vegetables to help until her first fill on 06/03/12.   Surgery date: 03/31/12 Surgery type: LAGB Start weight at [facility]: 315.4 lbs Pre-Op weight: 332.5 (03/16/12)  Weight today: 292.5 lbs Weight change: 12.0 lbs Total weight lost: 40.0 lbs (from 03/16/12) BMI:  50.2 kg/m^2  Weight goal: Pt does not have a goal at this time % Weight goal met: n/a  TANITA  BODY COMP RESULTS  04/15/12 05/29/12   %Fat 57.5% 55.5%   Fat Mass (lbs) 175.0 162.5   Fat Free Mass (lbs) 129.5 130.0   Total Body Water (lbs) 95.0 95.0   Fluid intake: > 64 oz Estimated total protein intake:  60-80g  Medications: No changes reported Supplementation: Taking as directed  Using straws: No Drinking while eating: No Hair loss: No Carbonated beverages: No N/V/D/C: None Last Lap-Band fill:  No fill yet  Recent physical activity:  Treadmill 20 min each morning and evening  Progress Towards Goal(s):  In progress.  Handouts given during visit include:  Phase 3B: High Protein + Non-Starchy Vegetables   Nutritional Diagnosis:  [place]-3.3 Overweight/obesity As related to history of poor food choices.  As evidenced by recent LAGB surgery and BMI of 50.2 kg/m^2.    Intervention:  Nutrition education/diet advancement.  Monitoring/Evaluation:  Dietary intake, exercise, lap band fills, and body weight. Follow up in 1 months for 3 month post-op visit.

## 2012-06-03 ENCOUNTER — Encounter (INDEPENDENT_AMBULATORY_CARE_PROVIDER_SITE_OTHER): Payer: Self-pay | Admitting: Surgery

## 2012-06-03 ENCOUNTER — Ambulatory Visit (INDEPENDENT_AMBULATORY_CARE_PROVIDER_SITE_OTHER): Payer: Medicare Other | Admitting: Surgery

## 2012-06-03 VITALS — BP 116/68 | HR 84 | Temp 98.0°F | Resp 16 | Ht 64.0 in | Wt 290.0 lb

## 2012-06-03 DIAGNOSIS — Z9884 Bariatric surgery status: Secondary | ICD-10-CM

## 2012-06-03 NOTE — Progress Notes (Signed)
Re:   Shannon Hobbs DOB:   [birth year] MRN:   [id number]  ASSESSMENT AND PLAN: 1.  Morbid obesity.  Weight 319, BMI - 54.9.  Lap Band - AP Large - 03/31/2012  She has done very well so far.  She is down about 30 pounds.  I adjusted her lap band today to get to 4.5 cc.  She will see me back in 8 weeks.   2.  Hypertension. 3.  Rheumatoid arthritis -  At least 20 years.    Was followed by Dr. Phylliss Bob, now followed by Dr. Dareen Piano. 4.  Chronic steroids - was taking prednisone 10 mg qd x 2 years.  She is tapering her dose.  She is now taking 5 mg daily. 5.  History of splenectomy/ectopic pregnancy. 6.  Quit smoking - Feb 2013. 7.  Chronic ibuprofen use.  She has not taken any ibuprofen since the operation.  We talked about this again. 8.  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  9.  On disability since 2007.  Chief Complaint  Patient presents with  . Lap Band Fill   REFERRING PHYSICIAN: HAGUE, Myrene Galas, MD  HISTORY OF PRESENT ILLNESS: Shannon Hobbs is a 56 y.o. (DOB: [birth year])  white female whose primary care physician is HAGUE, Myrene Galas, MD and comes to me today for follow up of lap band placement.    She is in good spirits and doing well.  She has two treadmills in the house. So she is putting time on the treadmill.  She has also done well adapting her diet.  She got rid of all the sodas in the house and she is enjoying drinking water.  Her husband is doing her diet and has lost about 15 pounds.  Psych eval - Dr. Tiajuana Amass - 01/09/2012    Past Medical History  Diagnosis Date  . Arthritis   . Hypertension   . Obesity   . Dysmetabolic syndrome      Past Surgical History  Procedure Date  . Dermoid cyst  excision   . Ectopic pregnancy surgery   . Appendectomy   . Cholecystectomy   . Splenectomy, total   . Breath tek h pylori 08/29/2011    Procedure: BREATH TEK H PYLORI;  Surgeon: Kandis Cocking, MD;  Location: [facility];  Service:  General;  Laterality: N/A;  . Laparoscopic gastric banding 03/31/2012    Procedure: LAPAROSCOPIC GASTRIC BANDING;  Surgeon: Kandis Cocking, MD;  Location: [facility];  Service: General;  Laterality: N/A;  EXCISION ABDOMINAL SKIN TAGS      Current Outpatient Prescriptions  Medication Sig Dispense Refill  . hydrochlorothiazide (HYDRODIURIL) 25 MG tablet Take 25 mg by mouth daily after breakfast.       . methotrexate (RHEUMATREX) 2.5 MG tablet Take 20 mg by mouth once a week. Caution:Chemotherapy. Protect from light.      . predniSONE (DELTASONE) 10 MG tablet Take 5-10 mg by mouth daily. Takes 5 mg one day and 10 mg the next        Allergies  Allergen Reactions  . Dilaudid (Hydromorphone Hcl) Nausea And Vomiting  . Percocet (Oxycodone-Acetaminophen) Nausea And Vomiting    REVIEW OF SYSTEMS: Cardiac:  Hypertension x 2 years.  But she admits that she is not good about taking her meds.  Gastrointestinal: She had an ectopic pregnancy.  She ended up getting a splenectomy, appendectomy, in addition to managing the ectopic. No history of stomach disease.  No history of liver disease.  Lap chole - 2008 in [place].  No history of pancreas disease.  No history of colon disease. Urologic:  No history of kidney stones.  No history of bladder infections. Musculoskeletal:  History of rheumatoid arthritis.  On methotrexate/prednisone.  Followed by Dr. Azzie Roup.   SOCIAL and FAMILY HISTORY: Recently married. On disability since 2007 secondary to rheumatoid arthritis.  PHYSICAL EXAM: BP 116/68  Pulse 84  Temp 98 F (36.7 C) (Temporal)  Resp 16  Ht 5\' 4"  (1.626 m)  Wt 290 lb (131.543 kg)  BMI 49.78 kg/m2  General: WN morbidly obese WF who is alert and generally healthy appearing.  Abdomen: Soft. No mass. No tenderness.  Port looks good.  Procedure:  While in the office, I accessed her lap band.  She had about 1.0 cc in the lap band.  I added 3.5 cc for a total of 4.5 cc.  She tolerated water.   She will stay on liquids for 2 days.  DATA REVIEWED: None new.  Ovidio Kin, MD,  [facility] [address] [place]  [address]   [place], [place]    [address] Phone:  [phone] FAX:  [phone]

## 2012-06-29 ENCOUNTER — Ambulatory Visit: Payer: Medicare Other | Admitting: *Deleted

## 2012-07-04 IMAGING — RF DG UGI W/ KUB
15 series · 15 of 15 positions shown · non-contrast
Comparison: none

CLINICAL DATA: Morbid obesity.  Pre-op evaluation for bariatric
surgery.

UPPER GI SERIES WITH KUB
TECHNIQUE: After obtaining a scout radiograph a single-column
upper GI series was performed using thin barium.
Fluoroscopy time: 1.5 minutes

[Series 1: run · 1 of 1 slices shown (1 of 13)]
[im 1/1]
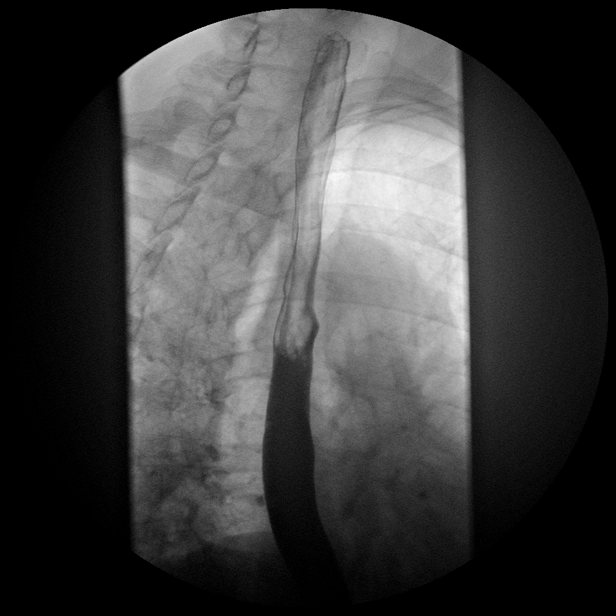

[Series 2: run · 1 of 1 slices shown (2 of 13)]
[im 1/1]
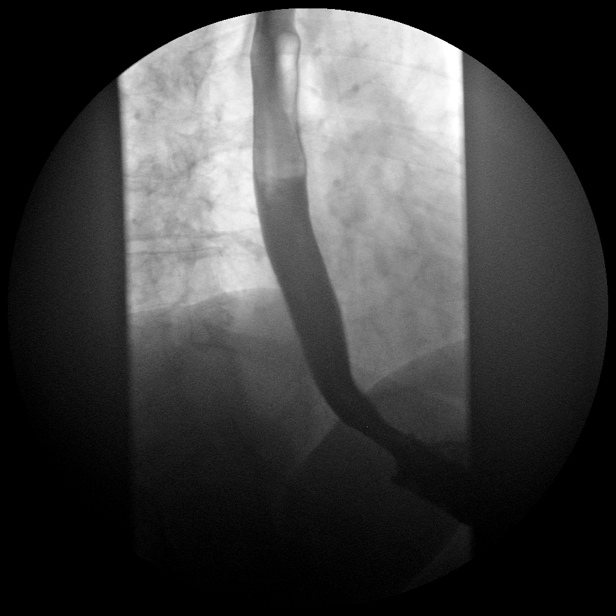

[Series 3: run · 1 of 1 slices shown (3 of 13)]
[im 1/1]
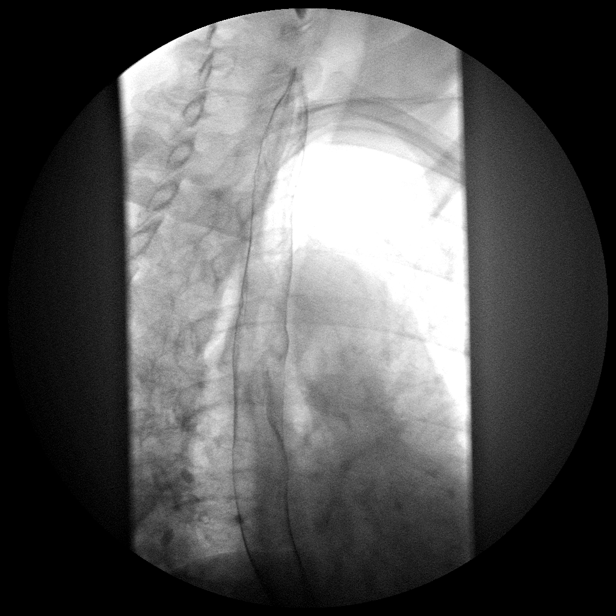

[Series 4: run · 1 of 1 slices shown (4 of 13)]
[im 1/1]
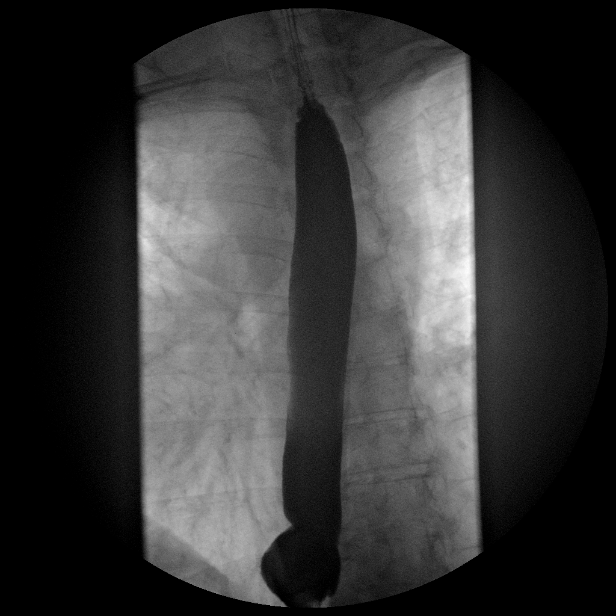

[Series 5: run · 1 of 1 slices shown (5 of 13)]
[im 1/1]
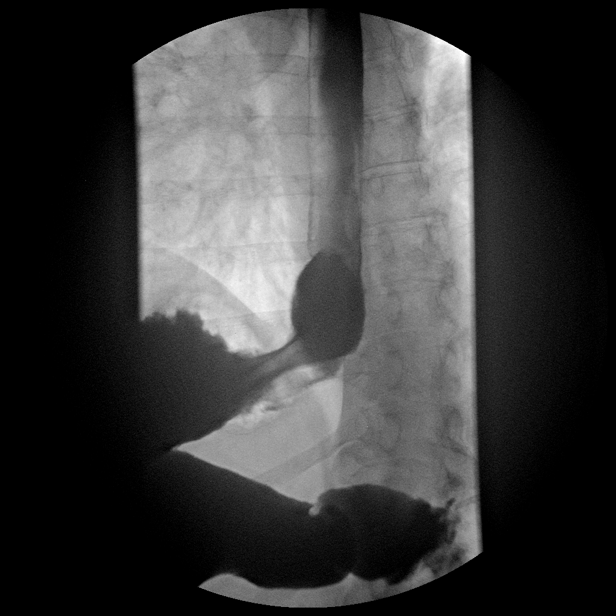

[Series 6: run · 1 of 1 slices shown (6 of 13)]
[im 1/1]
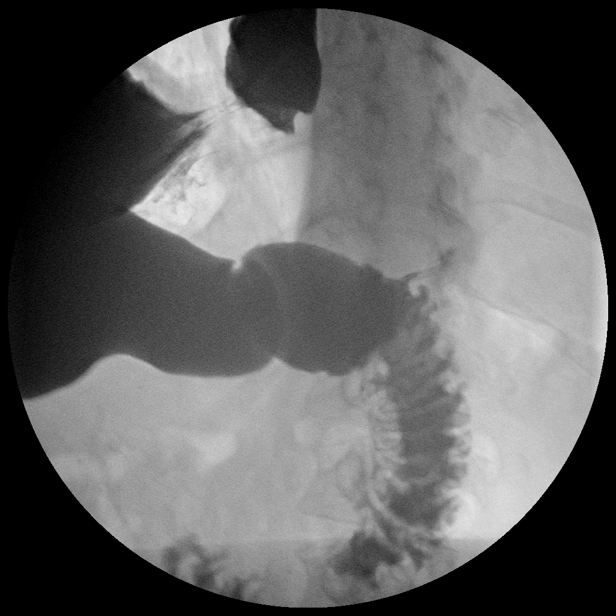

[Series 7: run · 1 of 1 slices shown (7 of 13)]
[im 1/1]
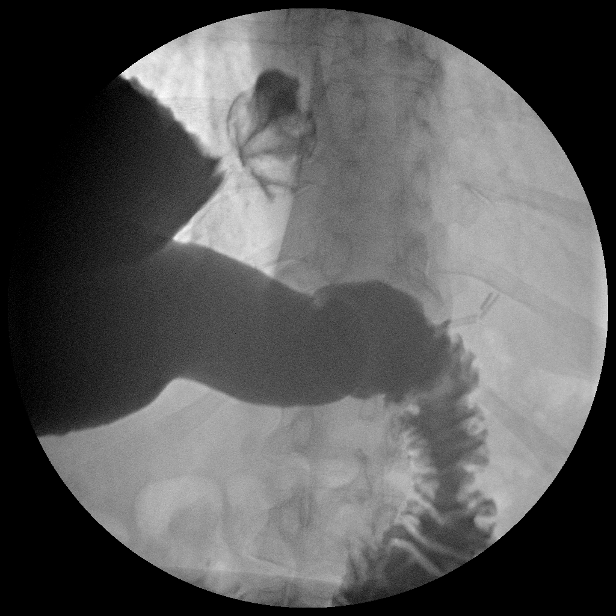

[Series 8: run · 1 of 1 slices shown (8 of 13)]
[im 1/1]
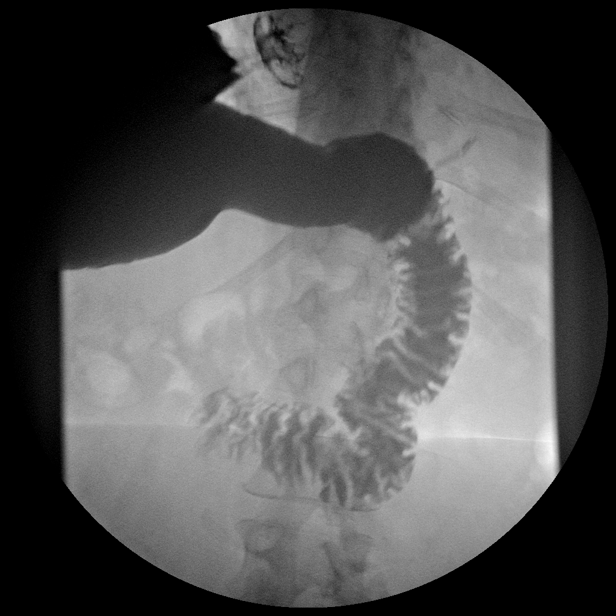

[Series 9: run · 1 of 1 slices shown (9 of 13)]
[im 1/1]
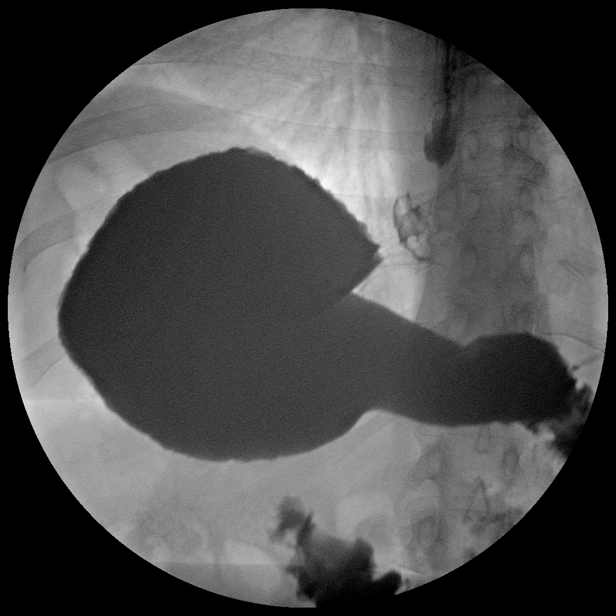

[Series 10: run · 1 of 1 slices shown (10 of 13)]
[im 1/1]
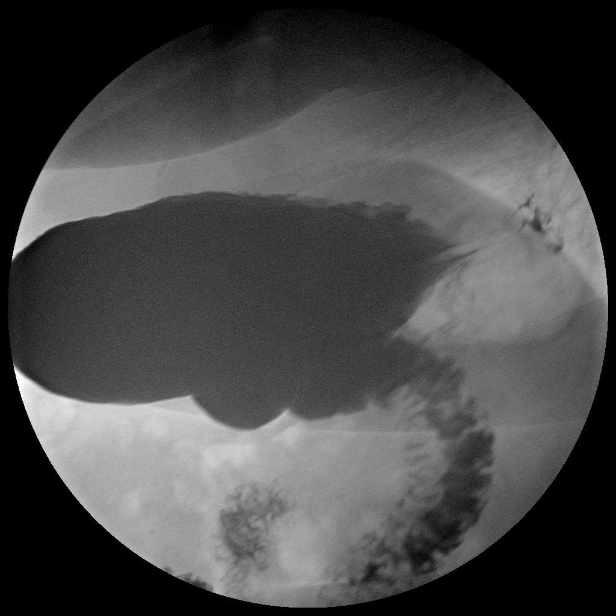

[Series 11: run · 1 of 1 slices shown (11 of 13)]
[im 1/1]
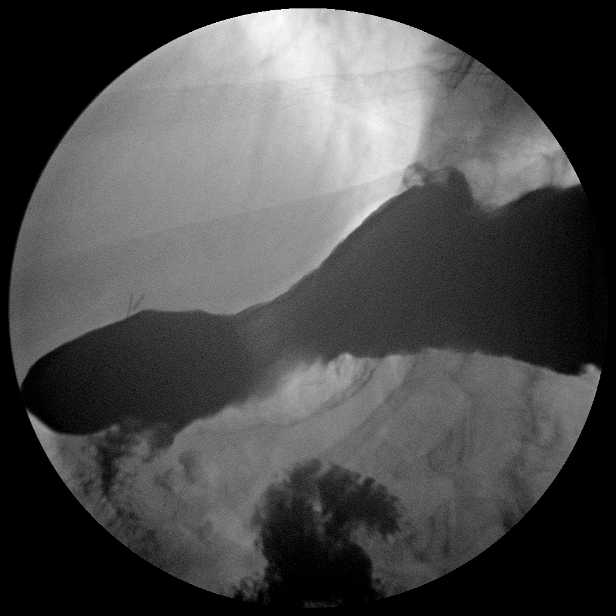

[Series 12: run · 1 of 1 slices shown (12 of 13)]
[im 1/1]
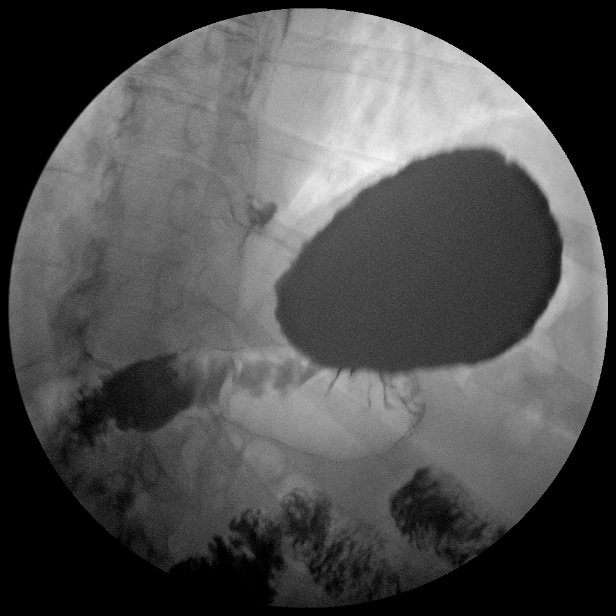

[Series 13: run · 1 of 1 slices shown (13 of 13)]
[im 1/1]
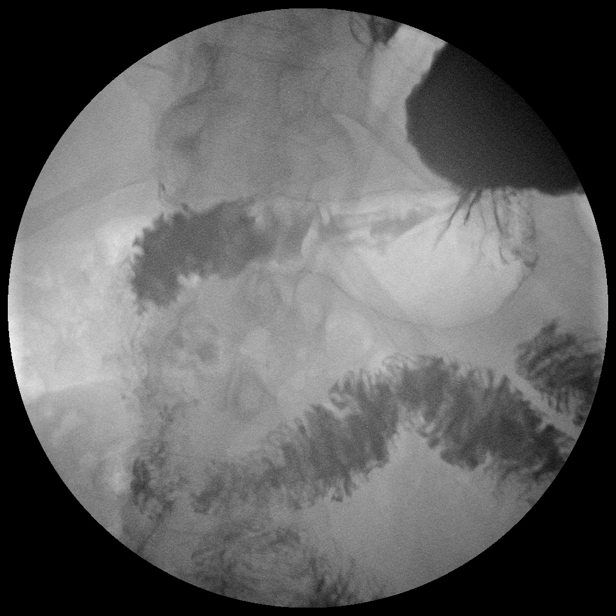

[Series 1001: view not recorded · 0.20mm/px · 1 of 1 slices shown (1 of 2)]
[im 1/1]
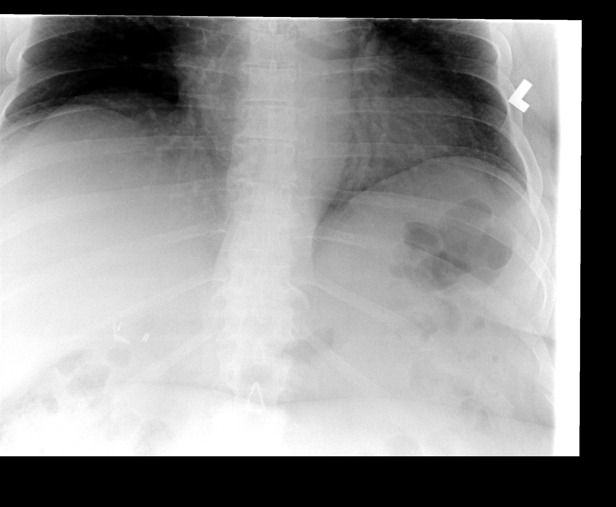

[Series 1002: view not recorded · 0.20mm/px · 1 of 1 slices shown (2 of 2)]
[im 1/1]
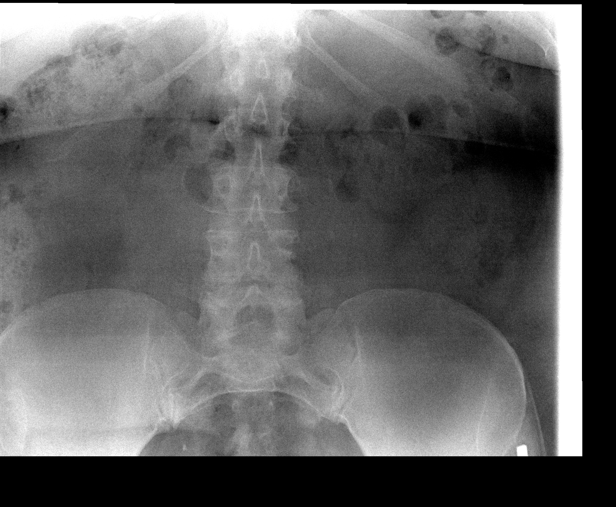

[15 of 15 positions shown; findings below may reference images not displayed]

FINDINGS: The scout radiograph shows a normal bowel gas pattern.
Surgical clips are seen from prior cholecystectomy.

There is no evidence of esophageal mass or stricture.  There is no
evidence of hiatal hernia, and no gastroesophageal reflux was seen
during the exam.  Esophageal motility is within normal limits.

The stomach is normal in appearance.  There is no evidence of
gastric masses or ulcers.  Duodenal bulb and sweep are normal in
appearance.
IMPRESSION: Negative UGI series.

## 2012-07-09 ENCOUNTER — Encounter (INDEPENDENT_AMBULATORY_CARE_PROVIDER_SITE_OTHER): Payer: Medicare Other

## 2012-07-13 ENCOUNTER — Ambulatory Visit: Payer: Self-pay | Admitting: *Deleted

## 2012-07-30 ENCOUNTER — Ambulatory Visit (INDEPENDENT_AMBULATORY_CARE_PROVIDER_SITE_OTHER): Payer: Medicare Other | Admitting: Physician Assistant

## 2012-07-30 ENCOUNTER — Encounter: Payer: Medicare Other | Attending: Surgery | Admitting: *Deleted

## 2012-07-30 ENCOUNTER — Encounter (INDEPENDENT_AMBULATORY_CARE_PROVIDER_SITE_OTHER): Payer: Medicare Other

## 2012-07-30 ENCOUNTER — Encounter (INDEPENDENT_AMBULATORY_CARE_PROVIDER_SITE_OTHER): Payer: Self-pay

## 2012-07-30 ENCOUNTER — Encounter: Payer: Self-pay | Admitting: *Deleted

## 2012-07-30 VITALS — BP 130/84 | HR 96 | Temp 96.5°F | Resp 18 | Ht 64.0 in | Wt 276.2 lb

## 2012-07-30 DIAGNOSIS — Z713 Dietary counseling and surveillance: Secondary | ICD-10-CM | POA: Insufficient documentation

## 2012-07-30 DIAGNOSIS — Z01818 Encounter for other preprocedural examination: Secondary | ICD-10-CM | POA: Insufficient documentation

## 2012-07-30 DIAGNOSIS — Z4651 Encounter for fitting and adjustment of gastric lap band: Secondary | ICD-10-CM

## 2012-07-30 NOTE — Patient Instructions (Signed)
Take clear liquids tonight. Thin protein shakes are ok to start tomorrow morning. Slowly advance your diet thereafter. Call us if you have persistent vomiting or regurgitation, night cough or reflux symptoms. Return as scheduled or sooner if you notice no changes in hunger/portion sizes.  

## 2012-07-30 NOTE — Progress Notes (Signed)
Follow-up visit:  12 Weeks Post-Operative LAGB Surgery  Medical Nutrition Therapy:  Appt start time: 250  End time:  315.  Primary concerns today: Post-operative Bariatric Surgery Nutrition Management. Has lost 17.5 lbs of FAT MASS since last visit!!  Reports rash and discomfort under breasts and under abdominal skin fold. States she is in "desperate need of a fill" as she can eat anything; otherwise following all post-op protocols to the letter.    Surgery date: 03/31/12 Surgery type: LAGB Start weight at [facility]: 315.4 lbs Pre-Op weight: 332.5 (03/16/12)  Weight today: 276.0 lbs Weight change: 16.5lbs Total weight lost: 56.5 lbs (from 03/16/12) BMI: 47.4 kg/m^2  Weight goal: 50 lbs more down by 01/29/13 % Weight goal met: n/a  TANITA  BODY COMP RESULTS  04/15/12 05/29/12 07/30/12   %Fat 57.5% 55.5% 52.5%   Fat Mass (lbs) 175.0 162.5 145.0   Fat Free Mass (lbs) 129.5 130.0 131.0   Total Body Water (lbs) 95.0 95.0 96.0   24 Hr Food Recall B: 1 egg or Atkins shake - 15g S: NONE L: 3-4 oz [place] breast, green beans - 30-35g S: 7 mini rice cakes D: 3-4 oz beef or pork, green beans or salad - 30-35g S: NONE  Fluid intake:  >100 oz water Estimated total protein intake: 75-85 g  Medications: No changes reported Supplementation: Taking as directed  Using straws: No Drinking while eating: No Hair loss: No Carbonated beverages: No N/V/D/C: None Last Lap-Band fill:  06/13/12 - total of 4.5 cc now. Definitely in the yellow zone and states "I can eat anything"  Recent physical activity:  Treadmill 30 min each morning  Progress Towards Goal(s):  In progress.   Nutritional Diagnosis:  [place]-3.3 Overweight/obesity As related to history of poor food choices.  As evidenced by recent LAGB surgery and BMI of 50.2 kg/m^2.    Intervention:  Nutrition education/diet advancement.  Monitoring/Evaluation:  Dietary intake, exercise, lap band fills, and body weight. Follow up in 3 months for 6 month  post-op visit.

## 2012-07-30 NOTE — Progress Notes (Signed)
  HISTORY: Shannon Hobbs is a 57 y.o.female who received an AP-Large lap-band in September 2013 by Dr. Ezzard Standing. She comes in with 14 lbs weight loss since her last visit with Dr. Ezzard Standing. She is complaining of continued hunger and larger than desired portion sizes. She denies regurgitation or reflux. She complains of right knee pain, which is not new for her. She is planning an appointment with her PCP for further care of this. She wants an adjustment today.  VITAL SIGNS: Filed Vitals:   07/30/12 1543  BP: 130/84  Pulse: 96  Temp: 96.5 F (35.8 C)  Resp: 18    PHYSICAL EXAM: Physical exam reveals a very well-appearing 57 y.o.female in no apparent distress Neurologic: Awake, alert, oriented Psych: Bright affect, conversant Respiratory: Breathing even and unlabored. No stridor or wheezing Abdomen: Soft, nontender, nondistended to palpation. Incisions well-healed. No incisional hernias. Port easily palpated. Extremities: Atraumatic, good range of motion.  ASSESMENT: 57 y.o.  female  s/p AP-Large lap-band.   PLAN: The patient's port was accessed with a 20G Huber needle without difficulty. Clear fluid was aspirated and 1 mL saline was added to the port to give a total predicted volume of 5.5 mL. The patient was able to swallow water without difficulty following the procedure and was instructed to take clear liquids for the next 24-48 hours and advance slowly as tolerated.

## 2012-07-30 NOTE — Patient Instructions (Addendum)
Goals:  Follow Phase 3B: High Protein + Non-Starchy Vegetables  Eat 3-6 small meals/snacks, every 3-5 hrs  Add 15 grams of carbohydrate (fruit, whole grain) with meals  Avoid drinking 15 minutes before, during and 30 minutes after eating

## 2012-08-21 ENCOUNTER — Encounter (INDEPENDENT_AMBULATORY_CARE_PROVIDER_SITE_OTHER): Payer: Medicare Other | Admitting: Surgery

## 2012-08-27 ENCOUNTER — Encounter (INDEPENDENT_AMBULATORY_CARE_PROVIDER_SITE_OTHER): Payer: Self-pay

## 2012-09-12 ENCOUNTER — Other Ambulatory Visit: Payer: Self-pay

## 2012-09-24 ENCOUNTER — Ambulatory Visit (INDEPENDENT_AMBULATORY_CARE_PROVIDER_SITE_OTHER): Payer: Medicare Other | Admitting: Physician Assistant

## 2012-09-24 ENCOUNTER — Encounter (INDEPENDENT_AMBULATORY_CARE_PROVIDER_SITE_OTHER): Payer: Self-pay

## 2012-09-24 VITALS — BP 128/80 | HR 72 | Temp 97.5°F | Resp 16 | Ht 64.0 in | Wt 267.6 lb

## 2012-09-24 DIAGNOSIS — Z4651 Encounter for fitting and adjustment of gastric lap band: Secondary | ICD-10-CM

## 2012-09-24 NOTE — Patient Instructions (Signed)
Take clear liquids tonight. Thin protein shakes are ok to start tomorrow morning. Slowly advance your diet thereafter. Call us if you have persistent vomiting or regurgitation, night cough or reflux symptoms. Return as scheduled or sooner if you notice no changes in hunger/portion sizes.  

## 2012-09-24 NOTE — Progress Notes (Signed)
  HISTORY: Shannon Hobbs is a 57 y.o.female who received an AP-Large lap-band in September 2013 by Dr. Ezzard Standing. She comes in with 8 lbs weight loss since her last visit about 6 weeks ago. She denies regurgitation or vomiting but she's noticed increased hunger and portion sizes. She would like a fill today.  VITAL SIGNS: Filed Vitals:   09/24/12 1607  BP: 128/80  Pulse: 72  Temp: 97.5 F (36.4 C)  Resp: 16    PHYSICAL EXAM: Physical exam reveals a very well-appearing 57 y.o.female in no apparent distress Neurologic: Awake, alert, oriented Psych: Bright affect, conversant Respiratory: Breathing even and unlabored. No stridor or wheezing Abdomen: Soft, nontender, nondistended to palpation. Incisions well-healed. No incisional hernias. Port easily palpated. Extremities: Atraumatic, good range of motion.  ASSESMENT: 57 y.o.  female  s/p AP-Large lap-band.   PLAN: The patient's port was accessed with a 20G Huber needle without difficulty. Clear fluid was aspirated and 1 mL saline was added to the port to give a total predicted volume of 6.5 mL. The patient was able to swallow water without difficulty following the procedure and was instructed to take clear liquids for the next 24-48 hours and advance slowly as tolerated.

## 2012-10-22 ENCOUNTER — Encounter (INDEPENDENT_AMBULATORY_CARE_PROVIDER_SITE_OTHER): Payer: Medicare Other

## 2012-10-30 ENCOUNTER — Ambulatory Visit: Payer: Medicare Other | Admitting: *Deleted

## 2012-11-26 ENCOUNTER — Encounter (INDEPENDENT_AMBULATORY_CARE_PROVIDER_SITE_OTHER): Payer: Medicare Other

## 2012-11-30 ENCOUNTER — Ambulatory Visit: Payer: Medicare Other | Admitting: *Deleted

## 2012-12-03 ENCOUNTER — Ambulatory Visit (INDEPENDENT_AMBULATORY_CARE_PROVIDER_SITE_OTHER): Payer: Medicare Other | Admitting: Physician Assistant

## 2012-12-03 ENCOUNTER — Encounter (INDEPENDENT_AMBULATORY_CARE_PROVIDER_SITE_OTHER): Payer: Self-pay

## 2012-12-03 VITALS — BP 120/76 | HR 106 | Temp 97.4°F | Resp 18 | Ht 64.0 in | Wt 259.8 lb

## 2012-12-03 DIAGNOSIS — Z4651 Encounter for fitting and adjustment of gastric lap band: Secondary | ICD-10-CM

## 2012-12-03 NOTE — Progress Notes (Signed)
  HISTORY: Shannon Hobbs is a 57 y.o.female who received an AP-Large lap-band in September 2013 by Dr. Ezzard Standing. She comes in with almost 8 lbs weight loss since her last visit in late February. She denies regurgitation or reflux symptoms. She does describe poor satiety and hunger, primarily in the afternoon and evening. She is getting a swimming pool installed this week. She's looking forward to water exercise given her arthritis.  VITAL SIGNS: Filed Vitals:   12/03/12 0906  BP: 120/76  Pulse: 106  Temp: 97.4 F (36.3 C)  Resp: 18    PHYSICAL EXAM: Physical exam reveals a very well-appearing 57 y.o.female in no apparent distress Neurologic: Awake, alert, oriented Psych: Bright affect, conversant Respiratory: Breathing even and unlabored. No stridor or wheezing Abdomen: Soft, nontender, nondistended to palpation. Incisions well-healed. No incisional hernias. Port easily palpated. Extremities: Atraumatic, good range of motion.  ASSESMENT: 57 y.o.  female  s/p AP-Large lap-band.   PLAN: The patient's port was accessed with a 20G Huber needle without difficulty. Clear fluid was aspirated and 0.5 mL saline was added to the port to give a total predicted volume of 7 mL. The patient was able to swallow water without difficulty following the procedure and was instructed to take clear liquids for the next 24-48 hours and advance slowly as tolerated.

## 2012-12-03 NOTE — Patient Instructions (Signed)
Take clear liquids tonight. Thin protein shakes are ok to start tomorrow morning. Slowly advance your diet thereafter. Call us if you have persistent vomiting or regurgitation, night cough or reflux symptoms. Return as scheduled or sooner if you notice no changes in hunger/portion sizes.  

## 2012-12-11 ENCOUNTER — Encounter: Payer: Self-pay | Admitting: *Deleted

## 2012-12-11 ENCOUNTER — Encounter: Payer: Medicare Other | Attending: Internal Medicine | Admitting: *Deleted

## 2012-12-11 DIAGNOSIS — Z713 Dietary counseling and surveillance: Secondary | ICD-10-CM | POA: Insufficient documentation

## 2012-12-11 DIAGNOSIS — Z9884 Bariatric surgery status: Secondary | ICD-10-CM | POA: Insufficient documentation

## 2012-12-11 DIAGNOSIS — E669 Obesity, unspecified: Secondary | ICD-10-CM | POA: Insufficient documentation

## 2012-12-11 DIAGNOSIS — Z09 Encounter for follow-up examination after completed treatment for conditions other than malignant neoplasm: Secondary | ICD-10-CM | POA: Insufficient documentation

## 2012-12-11 NOTE — Progress Notes (Signed)
Follow-up visit:  8.5 Months Post-Operative LAGB Surgery  Medical Nutrition Therapy:  Appt start time:  0915  End time:  1000.  Primary concerns today: Post-operative Bariatric Surgery Nutrition Management. Had a fill last week, though states she is getting up in the middle of the night and snacking because she is "starving".  Excessive fat intake noted via peanuts during the day and night. Would like to start 2 week pre-op diet to get back on track.  Also reports continuation of rash and discomfort under breasts and pannus; using Nystatin.      Surgery date: 03/31/12 Surgery type: LAGB Start weight at [facility]: 315.4 lbs Pre-Op weight: 332.5 (03/16/12)  Weight today: 259.5 lbs Weight change: 16.5 lbs Total weight lost: 72.5 lbs (from 03/16/12) Weight goal: 50 lbs more down by 01/29/13  TANITA  BODY COMP RESULTS  04/15/12 05/29/12 07/30/12 12/11/12   BMI (kg/m^2) 52.2 50.2 47.4 44.5   Fat Mass (lbs) 175.0 162.5 145.0 137.0   Fat Free Mass (lbs) 129.5 130.0 131.0 122.5   Total Body Water (lbs) 95.0 95.0 96.0 89.5   24 Hr Food Recall B: 1 egg, 1 pc reg sausage - 15g S:  NONE L:  LF soup OR 3 oz baked/grilled chicken thigh, zucchini - 25g   S:  Peanuts (2 oz) or cheese cubes (1.5 oz) - 20g D: 3 oz chicken, zucchini - 25g S: NONE S (middle of the night): Peanuts (2-3 oz) - 15-20g  Fluid intake:  > 100 oz water, 1 cup coffee w/ 1 tsp reg sugar Estimated total protein intake: 90-110 g  Medications: No changes reported Supplementation: Taking as directed  Using straws: No Drinking while eating: No Hair loss: No Carbonated beverages: No N/V/D/C: None Last Lap-Band fill:  12/03/12 - 0.5 cc added for total of 7.0 cc now. Definitely in the yellow zone and states "I can still eat anything". Returning in 4 weeks for f/u v  Recent physical activity:  Treadmill 30 min each morning; Pool being installed now and plans to do water exercise.   Progress Towards Goal(s):  In progress.   Nutritional  Diagnosis:  [place]-3.3 Overweight/obesity As related to history of poor food choices.  As evidenced by recent LAGB surgery and BMI of 50.2 kg/m^2.    Intervention:  Nutrition education/reinforcement.  Samples given during visit include:   Premier Protein Shake: 2 bottles Lot: [id number]; Exp: 08/10/13  Monitoring/Evaluation:  Dietary intake, exercise, lap band fills, and body weight. Follow up in 6 weeks for 10 month post-op visit.

## 2012-12-11 NOTE — Patient Instructions (Addendum)
Goals:  Follow 2 Week Pre-Op Diet for 2 weeks, then switch to the Bariatric Surgery Specialized Post-Op  Snack on lean proteins; Keep nuts to 1 oz/day  Try PB2 powdered peanut butter ([employer], [employer], [employer] [place])  Add 15 grams of carbohydrate (fruit, whole grain) with meals  Avoid drinking 15 minutes before, during and 30 minutes after eating

## 2013-01-07 ENCOUNTER — Encounter (INDEPENDENT_AMBULATORY_CARE_PROVIDER_SITE_OTHER): Payer: Medicare Other

## 2013-01-20 ENCOUNTER — Ambulatory Visit: Payer: Medicare Other | Admitting: *Deleted

## 2013-02-04 ENCOUNTER — Encounter (INDEPENDENT_AMBULATORY_CARE_PROVIDER_SITE_OTHER): Payer: Medicare Other

## 2013-06-03 ENCOUNTER — Other Ambulatory Visit: Payer: Self-pay

## 2013-07-01 ENCOUNTER — Encounter (INDEPENDENT_AMBULATORY_CARE_PROVIDER_SITE_OTHER): Payer: Medicare Other

## 2016-04-30 ENCOUNTER — Encounter (HOSPITAL_COMMUNITY): Payer: Self-pay

## 2017-10-29 ENCOUNTER — Encounter (HOSPITAL_COMMUNITY): Payer: Self-pay

## 2020-07-13 ENCOUNTER — Other Ambulatory Visit: Payer: Self-pay | Admitting: Student

## 2020-07-13 DIAGNOSIS — R109 Unspecified abdominal pain: Secondary | ICD-10-CM

## 2020-07-24 ENCOUNTER — Other Ambulatory Visit: Payer: Self-pay

## 2020-10-25 ENCOUNTER — Telehealth: Payer: Self-pay | Admitting: Gastroenterology

## 2020-10-25 NOTE — Telephone Encounter (Signed)
Hey Dr Lyndel Safe, this is a former pt of yours from [place] states she had a colonoscopy done 2-3 years ago with you and would like to schedule another one, would It be ok to schedule another colonoscopy.

## 2020-10-27 NOTE — Telephone Encounter (Signed)
Patient called to follow up on previous message upon looking into the archives patients last colonoscopy was in 2015. Please advise on recall thanks

## 2020-10-30 NOTE — Telephone Encounter (Signed)
Printed and placed on desk for review

## 2020-10-30 NOTE — Telephone Encounter (Signed)
Brooke, Can you please print last colonoscopy and give it to me next clinic I cannot access the archived notes RG

## 2020-10-31 NOTE — Telephone Encounter (Addendum)
Patient rescheduled for 5-10 at 11 for nurse and 6-15 at 8 for procedure and patient voiced understanding

## 2020-10-31 NOTE — Telephone Encounter (Signed)
Patient needs 2 DAY PREP for procedure

## 2020-10-31 NOTE — Telephone Encounter (Signed)
.  A user error has taken place: error

## 2020-10-31 NOTE — Telephone Encounter (Signed)
Patient was due 04/2017. Can do direct colonoscopy.   4/12 at 930 for nurse visit and 5/5 at 9 for procedure and patient voiced understanding

## 2020-11-30 ENCOUNTER — Encounter: Payer: Medicare Other | Admitting: Gastroenterology

## 2020-12-11 ENCOUNTER — Ambulatory Visit: Payer: Medicare Other

## 2020-12-12 ENCOUNTER — Other Ambulatory Visit: Payer: Self-pay

## 2020-12-12 ENCOUNTER — Ambulatory Visit (AMBULATORY_SURGERY_CENTER): Payer: Medicare Other | Admitting: *Deleted

## 2020-12-12 VITALS — Ht 63.0 in | Wt 270.0 lb

## 2020-12-12 DIAGNOSIS — Z86010 Personal history of colonic polyps: Secondary | ICD-10-CM

## 2020-12-12 MED ORDER — SUPREP BOWEL PREP KIT 17.5-3.13-1.6 GM/177ML PO SOLN: 1.0000 | Freq: Once | ORAL | 0 refills | Status: AC

## 2020-12-12 NOTE — Progress Notes (Signed)
Pt verified name, DOB, address and insurance during PV today. Pt mailed instruction packet to included paper to complete and mail back to [facility] with addressed and stamped envelope, Emmi video, copy of consent form to read and not return, and instructions. PV completed over the phone. Pt encouraged to call with questions or issues. My Chart instructions to pt as well    No egg or soy allergy known to patient  No issues with past sedation with any surgeries or procedures Patient denies ever being told they had issues or difficulty with intubation  No FH of Malignant Hyperthermia No diet pills per patient No home 02 use per patient  No blood thinners per patient  Pt denies issues with constipation - last colon inadequate  prep-  2 day Suprep   No A fib or A flutter  EMMI video to pt or via [place] 19 guidelines implemented in PV today with Pt and RN  Pt is fully vaccinated  for Covid   Due to the COVID-19 pandemic we are asking patients to follow certain guidelines.  Pt aware of COVID protocols and [facility] guidelines

## 2021-01-10 ENCOUNTER — Encounter: Payer: Self-pay | Admitting: Gastroenterology

## 2021-01-10 ENCOUNTER — Ambulatory Visit (AMBULATORY_SURGERY_CENTER): Payer: Medicare Other | Admitting: Gastroenterology

## 2021-01-10 ENCOUNTER — Other Ambulatory Visit: Payer: Self-pay

## 2021-01-10 VITALS — BP 135/83 | HR 91 | Temp 98.0°F | Resp 17 | Ht 64.0 in | Wt 270.0 lb

## 2021-01-10 DIAGNOSIS — D125 Benign neoplasm of sigmoid colon: Secondary | ICD-10-CM | POA: Diagnosis not present

## 2021-01-10 DIAGNOSIS — D123 Benign neoplasm of transverse colon: Secondary | ICD-10-CM | POA: Diagnosis not present

## 2021-01-10 DIAGNOSIS — D120 Benign neoplasm of cecum: Secondary | ICD-10-CM

## 2021-01-10 DIAGNOSIS — Z86010 Personal history of colonic polyps: Secondary | ICD-10-CM | POA: Diagnosis not present

## 2021-01-10 MED ORDER — SODIUM CHLORIDE 0.9 % IV SOLN: 500.0000 mL | Freq: Once | INTRAVENOUS | Status: DC

## 2021-01-10 NOTE — Progress Notes (Signed)
Called to room to assist during endoscopic procedure.  Patient ID and intended procedure confirmed with present staff. Received instructions for my participation in the procedure from the performing physician.  

## 2021-01-10 NOTE — Op Note (Signed)
[facility] Patient Name: Shannon Hobbs Procedure Date: 01/10/2021 7:11 AM MRN: [id number] Endoscopist: Jackquline Denmark , MD Age: 65 Referring MD:  Date of Birth: 1956/04/28 Gender: Female Account #: [id number] Procedure:                Colonoscopy Indications:              High risk colon cancer surveillance: Personal                            history of colonic polyps Medicines:                Monitored Anesthesia Care Procedure:                Pre-Anesthesia Assessment:                           - Prior to the procedure, a History and Physical                            was performed, and patient medications and                            allergies were reviewed. The patient's tolerance of                            previous anesthesia was also reviewed. The risks                            and benefits of the procedure and the sedation                            options and risks were discussed with the patient.                            All questions were answered, and informed consent                            was obtained. Prior Anticoagulants: The patient has                            taken no previous anticoagulant or antiplatelet                            agents. ASA Grade Assessment: III - A patient with                            severe systemic disease. After reviewing the risks                            and benefits, the patient was deemed in                            satisfactory condition to undergo the procedure.  After obtaining informed consent, the colonoscope                            was passed under direct vision. Throughout the                            procedure, the patient's blood pressure, pulse, and                            oxygen saturations were monitored continuously. The                            Olympus CF-HQ190L [id number]) Colonoscope was                            introduced through the anus and advanced to  the the                            cecum, identified by appendiceal orifice and                            ileocecal valve. The colonoscopy was performed with                            moderate difficulty due to limited bowel prep                            (specially in the right side of the colon despite                            2-day prep), a tortuous colon and the patient's                            body habitus. Aggressive suctioning and aspiration                            was performed. The patient tolerated the procedure                            well. The quality of the bowel preparation was                            adequate to identify polyps 6 mm and larger in                            size. The ileocecal valve, appendiceal orifice, and                            rectum were photographed. Scope In: 8:15:06 AM Scope Out: 8:44:27 AM Scope Withdrawal Time: 0 hours 22 minutes 37 seconds  Total Procedure Duration: 0 hours 29 minutes 21 seconds  Findings:                 Four sessile and semi-sessile polyps  were found in                            the sigmoid colon, transverse colon and cecum. The                            polyps were 6 to 10 mm in size. These polyps were                            removed with a cold snare. Resection and retrieval                            were complete.                           A few medium-mouthed diverticula were found in the                            sigmoid colon.                           Non-bleeding internal hemorrhoids were found during                            retroflexion. The hemorrhoids were moderate.                           The exam was otherwise grossly without abnormality.                            Of note that small and flat lesions could have been                            missed because of quality of preparation (despite                            2-day prep) Complications:            No immediate  complications. Estimated Blood Loss:     Estimated blood loss: none. Impression:               - Four 6 to 10 mm polyps in the sigmoid colon, in                            the transverse colon and in the cecum, removed with                            a cold snare. Resected and retrieved.                           - Mild sigmoid diverticulosis.                           - Non-bleeding internal hemorrhoids.                           -  The examination was otherwise normal. Recommendation:           - Patient has a contact number available for                            emergencies. The signs and symptoms of potential                            delayed complications were discussed with the                            patient. Return to normal activities tomorrow.                            Written discharge instructions were provided to the                            patient.                           - Resume previous diet.                           - Continue present medications.                           - Await pathology results.                           - Miralax 1 capful (17 grams) in 8 ounces of water                            PO BID.                           - Repeat colonoscopy for surveillance based on                            pathology results, likely in 1 year with 2 to 3-day                            prep..                           - The findings and recommendations were discussed                            with the patient's family. Jackquline Denmark, MD 01/10/2021 8:51:32 AM This report has been signed electronically.

## 2021-01-10 NOTE — Progress Notes (Signed)
Pt's states no medical or surgical changes since previsit or office visit.   VS taken by CW 

## 2021-01-10 NOTE — Patient Instructions (Signed)
Use miralax 1 capful in 8 oz of water twice daily.  Read all of the handouts given to you by your recovery room nurse.  YOU HAD AN ENDOSCOPIC PROCEDURE TODAY AT [facility]:   Refer to the procedure report that was given to you for any specific questions about what was found during the examination.  If the procedure report does not answer your questions, please call your gastroenterologist to clarify.  If you requested that your care partner not be given the details of your procedure findings, then the procedure report has been included in a sealed envelope for you to review at your convenience later.  YOU SHOULD EXPECT: Some feelings of bloating in the abdomen. Passage of more gas than usual.  Walking can help get rid of the air that was put into your GI tract during the procedure and reduce the bloating. If you had a lower endoscopy (such as a colonoscopy or flexible sigmoidoscopy) you may notice spotting of blood in your stool or on the toilet paper. If you underwent a bowel prep for your procedure, you may not have a normal bowel movement for a few days.  Please Note:  You might notice some irritation and congestion in your nose or some drainage.  This is from the oxygen used during your procedure.  There is no need for concern and it should clear up in a day or so.  SYMPTOMS TO REPORT IMMEDIATELY:  Following lower endoscopy (colonoscopy or flexible sigmoidoscopy):  Excessive amounts of blood in the stool  Significant tenderness or worsening of abdominal pains  Swelling of the abdomen that is new, acute  Fever of 100F or higher    For urgent or emergent issues, a gastroenterologist can be reached at any hour by calling [phone]. Do not use MyChart messaging for urgent concerns.    DIET:  We do recommend a small meal at first, but then you may proceed to your regular diet.  Drink plenty of fluids but you should avoid alcoholic beverages for 24 hours. Try to increase  the fiber in your diet, and drink plenty of water.  ACTIVITY:  You should plan to take it easy for the rest of today and you should NOT DRIVE or use heavy machinery until tomorrow (because of the sedation medicines used during the test).    FOLLOW UP: Our staff will call the number listed on your records 48-72 hours following your procedure to check on you and address any questions or concerns that you may have regarding the information given to you following your procedure. If we do not reach you, we will leave a message.  We will attempt to reach you two times.  During this call, we will ask if you have developed any symptoms of COVID 19. If you develop any symptoms (ie: fever, flu-like symptoms, shortness of breath, cough etc.) before then, please call [phone].  If you test positive for Covid 19 in the 2 weeks post procedure, please call and report this information to [place].    If any biopsies were taken you will be contacted by phone or by letter within the next 1-3 weeks.  Please call us at [phone] if you have not heard about the biopsies in 3 weeks.    SIGNATURES/CONFIDENTIALITY: You and/or your care partner have signed paperwork which will be entered into your electronic medical record.  These signatures attest to the fact that that the information above on your After Visit Summary has  been reviewed and is understood.  Full responsibility of the confidentiality of this discharge information lies with you and/or your care-partner.  

## 2021-01-10 NOTE — Progress Notes (Signed)
PT taken to PACU. Monitors in place. VSS. Report given to RN. 

## 2021-01-12 ENCOUNTER — Telehealth: Payer: Self-pay | Admitting: *Deleted

## 2021-01-12 NOTE — Telephone Encounter (Signed)
  Follow up Call-  Call back number 01/10/2021  Post procedure Call Back phone  # [phone]  Permission to leave phone message Yes  Some recent data might be hidden     Patient questions:  Do you have a fever, pain , or abdominal swelling? No. Pain Score  0 *  Have you tolerated food without any problems? Yes.    Have you been able to return to your normal activities? Yes.    Do you have any questions about your discharge instructions: Diet   No. Medications  No. Follow up visit  No.  Do you have questions or concerns about your Care? No.  Actions: * If pain score is 4 or above: No action needed, pain <4.Have you developed a fever since your procedure? no  2.   Have you had an respiratory symptoms (SOB or cough) since your procedure? no  3.   Have you tested positive for COVID 19 since your procedure no  4.   Have you had any family members/close contacts diagnosed with the COVID 19 since your procedure?  no   If yes to any of these questions please route to Joylene John, RN and Joella Prince, RN

## 2021-01-18 ENCOUNTER — Encounter: Payer: Self-pay | Admitting: Gastroenterology
